# Patient Record
Sex: Female | Born: 1950 | Race: White | Hispanic: No | Marital: Married | Smoking: Never smoker
Health system: Southern US, Community
[De-identification: ages and names within clinical notes are randomized; demographics above are authoritative.]

## PROBLEM LIST (undated history)

## (undated) DIAGNOSIS — M810 Age-related osteoporosis without current pathological fracture: Secondary | ICD-10-CM

## (undated) DIAGNOSIS — Z8669 Personal history of other diseases of the nervous system and sense organs: Secondary | ICD-10-CM

## (undated) DIAGNOSIS — G43909 Migraine, unspecified, not intractable, without status migrainosus: Secondary | ICD-10-CM

## (undated) DIAGNOSIS — F419 Anxiety disorder, unspecified: Secondary | ICD-10-CM

## (undated) DIAGNOSIS — F319 Bipolar disorder, unspecified: Secondary | ICD-10-CM

## (undated) DIAGNOSIS — Z87440 Personal history of urinary (tract) infections: Secondary | ICD-10-CM

## (undated) DIAGNOSIS — C801 Malignant (primary) neoplasm, unspecified: Secondary | ICD-10-CM

## (undated) HISTORY — PX: ROTATOR CUFF REPAIR: SHX139

## (undated) HISTORY — PX: TOE SURGERY: SHX1073

## (undated) HISTORY — DX: Migraine, unspecified, not intractable, without status migrainosus: G43.909

## (undated) HISTORY — DX: Personal history of urinary (tract) infections: Z87.440

## (undated) HISTORY — DX: Malignant (primary) neoplasm, unspecified: C80.1

## (undated) HISTORY — PX: SKIN CANCER EXCISION: SHX779

## (undated) HISTORY — PX: COLONOSCOPY: SHX174

## (undated) HISTORY — DX: Anxiety disorder, unspecified: F41.9

## (undated) HISTORY — DX: Bipolar disorder, unspecified: F31.9

## (undated) HISTORY — DX: Personal history of other diseases of the nervous system and sense organs: Z86.69

## (undated) HISTORY — DX: Age-related osteoporosis without current pathological fracture: M81.0

---

## 1998-05-01 ENCOUNTER — Ambulatory Visit (HOSPITAL_COMMUNITY): Admission: RE | Admit: 1998-05-01 | Discharge: 1998-05-01 | Payer: Self-pay | Admitting: *Deleted

## 2000-09-30 ENCOUNTER — Other Ambulatory Visit: Admission: RE | Admit: 2000-09-30 | Discharge: 2000-09-30 | Payer: Self-pay

## 2002-01-22 ENCOUNTER — Other Ambulatory Visit: Admission: RE | Admit: 2002-01-22 | Discharge: 2002-01-22 | Payer: Self-pay | Admitting: Internal Medicine

## 2002-02-05 ENCOUNTER — Encounter: Admission: RE | Admit: 2002-02-05 | Discharge: 2002-02-05 | Payer: Self-pay | Admitting: Internal Medicine

## 2002-02-05 ENCOUNTER — Encounter: Payer: Self-pay | Admitting: Internal Medicine

## 2003-10-14 ENCOUNTER — Other Ambulatory Visit: Admission: RE | Admit: 2003-10-14 | Discharge: 2003-10-14 | Payer: Self-pay | Admitting: Internal Medicine

## 2005-07-01 ENCOUNTER — Other Ambulatory Visit: Admission: RE | Admit: 2005-07-01 | Discharge: 2005-07-01 | Payer: Self-pay | Admitting: Internal Medicine

## 2006-10-06 ENCOUNTER — Encounter: Admission: RE | Admit: 2006-10-06 | Discharge: 2006-10-06 | Payer: Self-pay | Admitting: Internal Medicine

## 2007-09-21 ENCOUNTER — Other Ambulatory Visit: Admission: RE | Admit: 2007-09-21 | Discharge: 2007-09-21 | Payer: Self-pay | Admitting: Internal Medicine

## 2008-11-25 ENCOUNTER — Ambulatory Visit: Payer: Self-pay | Admitting: Internal Medicine

## 2008-11-25 ENCOUNTER — Other Ambulatory Visit: Admission: RE | Admit: 2008-11-25 | Discharge: 2008-11-25 | Payer: Self-pay | Admitting: Internal Medicine

## 2009-12-23 ENCOUNTER — Ambulatory Visit: Payer: Self-pay | Admitting: Internal Medicine

## 2010-01-27 ENCOUNTER — Ambulatory Visit: Payer: Self-pay | Admitting: Internal Medicine

## 2010-03-09 ENCOUNTER — Ambulatory Visit: Payer: Self-pay | Admitting: Internal Medicine

## 2010-05-19 ENCOUNTER — Ambulatory Visit: Payer: Self-pay | Admitting: Internal Medicine

## 2011-03-30 ENCOUNTER — Other Ambulatory Visit: Payer: BC Managed Care – PPO | Admitting: Internal Medicine

## 2011-03-30 DIAGNOSIS — R5383 Other fatigue: Secondary | ICD-10-CM

## 2011-03-30 DIAGNOSIS — Z79899 Other long term (current) drug therapy: Secondary | ICD-10-CM

## 2011-03-30 DIAGNOSIS — Z0000 Encounter for general adult medical examination without abnormal findings: Secondary | ICD-10-CM

## 2011-03-30 LAB — COMPREHENSIVE METABOLIC PANEL
ALT: 19 U/L (ref 0–35)
AST: 21 U/L (ref 0–37)
Albumin: 4.5 g/dL (ref 3.5–5.2)
Alkaline Phosphatase: 51 U/L (ref 39–117)
BUN: 16 mg/dL (ref 6–23)
CO2: 24 mEq/L (ref 19–32)
Calcium: 10.6 mg/dL — ABNORMAL HIGH (ref 8.4–10.5)
Chloride: 105 mEq/L (ref 96–112)
Creat: 0.92 mg/dL (ref 0.50–1.10)
Glucose, Bld: 90 mg/dL (ref 70–99)
Potassium: 4.6 mEq/L (ref 3.5–5.3)
Sodium: 138 mEq/L (ref 135–145)
Total Bilirubin: 0.8 mg/dL (ref 0.3–1.2)
Total Protein: 6.7 g/dL (ref 6.0–8.3)

## 2011-03-30 LAB — CBC WITH DIFFERENTIAL/PLATELET
Basophils Absolute: 0.1 10*3/uL (ref 0.0–0.1)
Basophils Relative: 2 % — ABNORMAL HIGH (ref 0–1)
Eosinophils Absolute: 0.5 10*3/uL (ref 0.0–0.7)
Eosinophils Relative: 7 % — ABNORMAL HIGH (ref 0–5)
HCT: 41.7 % (ref 36.0–46.0)
Hemoglobin: 13.4 g/dL (ref 12.0–15.0)
Lymphocytes Relative: 33 % (ref 12–46)
Lymphs Abs: 2 10*3/uL (ref 0.7–4.0)
MCH: 33.1 pg (ref 26.0–34.0)
MCHC: 32.1 g/dL (ref 30.0–36.0)
MCV: 103 fL — ABNORMAL HIGH (ref 78.0–100.0)
Monocytes Absolute: 0.6 10*3/uL (ref 0.1–1.0)
Monocytes Relative: 9 % (ref 3–12)
Neutro Abs: 3 10*3/uL (ref 1.7–7.7)
Neutrophils Relative %: 49 % (ref 43–77)
Platelets: 269 10*3/uL (ref 150–400)
RBC: 4.05 MIL/uL (ref 3.87–5.11)
RDW: 13.4 % (ref 11.5–15.5)
WBC: 6.1 10*3/uL (ref 4.0–10.5)

## 2011-03-30 LAB — LIPID PANEL
HDL: 80 mg/dL (ref >39)
LDL Cholesterol: 91 mg/dL (ref 0–99)
Total CHOL/HDL Ratio: 2.2 Ratio

## 2011-03-30 LAB — TSH: TSH: 2.872 u[IU]/mL (ref 0.350–4.500)

## 2011-03-31 ENCOUNTER — Encounter: Payer: Self-pay | Admitting: Internal Medicine

## 2011-04-01 ENCOUNTER — Ambulatory Visit (INDEPENDENT_AMBULATORY_CARE_PROVIDER_SITE_OTHER): Payer: BC Managed Care – PPO | Admitting: Internal Medicine

## 2011-04-01 ENCOUNTER — Encounter: Payer: Self-pay | Admitting: Internal Medicine

## 2011-04-01 VITALS — BP 130/82 | HR 72 | Temp 98.7°F | Ht 63.75 in | Wt 129.0 lb

## 2011-04-01 DIAGNOSIS — F319 Bipolar disorder, unspecified: Secondary | ICD-10-CM

## 2011-04-01 DIAGNOSIS — G43909 Migraine, unspecified, not intractable, without status migrainosus: Secondary | ICD-10-CM

## 2011-04-01 DIAGNOSIS — Z87440 Personal history of urinary (tract) infections: Secondary | ICD-10-CM

## 2011-04-01 DIAGNOSIS — Z0000 Encounter for general adult medical examination without abnormal findings: Secondary | ICD-10-CM

## 2011-04-01 DIAGNOSIS — Z8669 Personal history of other diseases of the nervous system and sense organs: Secondary | ICD-10-CM | POA: Insufficient documentation

## 2011-04-01 LAB — POCT URINALYSIS DIPSTICK
Ketones, UA: NEGATIVE
Protein, UA: NEGATIVE
Spec Grav, UA: 1.01
pH, UA: 6

## 2011-04-01 NOTE — Patient Instructions (Signed)
Continue meds as previously described. Get mammogram and colonoscopy.

## 2011-04-01 NOTE — Progress Notes (Signed)
  Subjective:    Patient ID: Julia Lee, female    DOB: 1951/08/09, 60 y.o.   MRN: [id number]  HPI pleasant 60 year old white female with history of bipolar disorder well-controlled of lithium in today for health maintenance exam. History of migraine headaches and recurrent urinary tract infections. 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 No recent mammogram on file. Order for mammogram given to her today and she is to make her own appointment. She is off estrogen replacement therapy and migraines have improved. Please migraines was admitted nasal spray as needed. Last tetanus immunization April 2011. No complaints or problems today. In never had screening colonoscopy. Reminded about this today.    Review of Systems  Constitutional: Negative.   HENT: Negative.   Respiratory: Negative.   Cardiovascular: Negative.   Gastrointestinal: Negative.   Genitourinary: Negative.   Musculoskeletal: Negative.   Neurological: Negative.   Hematological: Negative.   Psychiatric/Behavioral: Negative.        Objective:   Physical Exam  Constitutional: She is oriented to person, place, and time. She appears well-nourished. No distress.  HENT:  Head: Normocephalic and atraumatic.  Right Ear: External ear normal.  Left Ear: External ear normal.  Nose: Nose normal.  Mouth/Throat: Oropharynx is clear and moist. No oropharyngeal exudate.  Eyes: Conjunctivae and EOM are normal. Pupils are equal, round, and reactive to light. Right eye exhibits no discharge. Left eye exhibits no discharge. No scleral icterus.  Neck: Normal range of motion. Neck supple. No JVD present. No tracheal deviation present. No thyromegaly present.  Cardiovascular: Normal rate, regular rhythm, normal heart sounds and intact distal pulses.  Exam reveals no gallop and no friction rub.   No murmur heard. Pulmonary/Chest: Effort normal and breath sounds normal. No stridor. No respiratory distress. She has no wheezes. She  has no rales. She exhibits no tenderness.  Abdominal: Soft. Bowel sounds are normal. She exhibits no distension and no mass. There is no tenderness. There is no rebound and no guarding.  Genitourinary: Uterus normal.       Bimanual only. Last PAP was 2010 and next PAP due is 2013  Musculoskeletal: She exhibits no edema and no tenderness.  Lymphadenopathy:    She has no cervical adenopathy.  Neurological: She is alert and oriented to person, place, and time. She has normal reflexes. She displays normal reflexes. No cranial nerve deficit. She exhibits normal muscle tone. Coordination normal.  Skin: Skin is warm and dry. No rash noted. She is not diaphoretic. No erythema.  Psychiatric: She has a normal mood and affect. Her behavior is normal. Judgment and thought content normal.          Assessment & Plan:  History of bipolar disorder  History of migraine headaches improved off estrogen replacement therapy  History of recurrent urinary tract infections none recently  Plan refills I made nasal spray when necessary 1 year. Okay to refill lithium for one year if drugstore calls. Otherwise return for physical exam in one year.

## 2011-04-07 ENCOUNTER — Other Ambulatory Visit: Payer: Self-pay

## 2011-04-09 ENCOUNTER — Other Ambulatory Visit: Payer: Self-pay

## 2011-04-09 MED ORDER — LITHIUM CARBONATE 300 MG PO TABS: 300.0000 mg | ORAL_TABLET | Freq: Every day | ORAL | Status: DC

## 2011-09-23 ENCOUNTER — Other Ambulatory Visit: Payer: Self-pay | Admitting: Internal Medicine

## 2011-09-23 DIAGNOSIS — Z1231 Encounter for screening mammogram for malignant neoplasm of breast: Secondary | ICD-10-CM

## 2011-10-19 ENCOUNTER — Ambulatory Visit
Admission: RE | Admit: 2011-10-19 | Discharge: 2011-10-19 | Disposition: A | Discharge status: Home / Self Care | Payer: BC Managed Care – PPO | Source: Ambulatory Visit | Attending: Internal Medicine | Admitting: Internal Medicine

## 2011-10-19 DIAGNOSIS — Z1231 Encounter for screening mammogram for malignant neoplasm of breast: Secondary | ICD-10-CM

## 2012-01-13 ENCOUNTER — Other Ambulatory Visit: Payer: Self-pay

## 2012-01-13 MED ORDER — LITHIUM CARBONATE 300 MG PO TABS: 300.0000 mg | ORAL_TABLET | Freq: Every day | ORAL | Status: DC

## 2012-04-03 ENCOUNTER — Other Ambulatory Visit: Payer: BC Managed Care – PPO | Admitting: Internal Medicine

## 2012-04-04 ENCOUNTER — Encounter: Payer: BC Managed Care – PPO | Admitting: Internal Medicine

## 2012-04-21 DIAGNOSIS — G43709 Chronic migraine without aura, not intractable, without status migrainosus: Secondary | ICD-10-CM

## 2012-06-18 ENCOUNTER — Encounter (HOSPITAL_COMMUNITY): Payer: Self-pay

## 2012-06-18 ENCOUNTER — Emergency Department (HOSPITAL_COMMUNITY): Payer: BC Managed Care – PPO

## 2012-06-18 ENCOUNTER — Emergency Department (HOSPITAL_COMMUNITY)
Admission: EM | Admit: 2012-06-18 | Discharge: 2012-06-19 | Disposition: A | Discharge status: Home / Self Care | Payer: BC Managed Care – PPO | Attending: Emergency Medicine | Admitting: Emergency Medicine

## 2012-06-18 DIAGNOSIS — Z79899 Other long term (current) drug therapy: Secondary | ICD-10-CM | POA: Insufficient documentation

## 2012-06-18 DIAGNOSIS — Y9355 Activity, bike riding: Secondary | ICD-10-CM | POA: Insufficient documentation

## 2012-06-18 DIAGNOSIS — F319 Bipolar disorder, unspecified: Secondary | ICD-10-CM | POA: Insufficient documentation

## 2012-06-18 DIAGNOSIS — S301XXA Contusion of abdominal wall, initial encounter: Secondary | ICD-10-CM | POA: Insufficient documentation

## 2012-06-18 DIAGNOSIS — Z85828 Personal history of other malignant neoplasm of skin: Secondary | ICD-10-CM | POA: Insufficient documentation

## 2012-06-18 LAB — CBC WITH DIFFERENTIAL/PLATELET
Basophils Relative: 1 % (ref 0–1)
Eosinophils Relative: 8 % — ABNORMAL HIGH (ref 0–5)
HCT: 37.7 % (ref 36.0–46.0)
Hemoglobin: 12.8 g/dL (ref 12.0–15.0)
MCH: 32.7 pg (ref 26.0–34.0)
MCHC: 34 g/dL (ref 30.0–36.0)
MCV: 96.2 fL (ref 78.0–100.0)
Monocytes Absolute: 0.7 10*3/uL (ref 0.1–1.0)
Monocytes Relative: 9 % (ref 3–12)
Neutro Abs: 4.5 10*3/uL (ref 1.7–7.7)

## 2012-06-18 LAB — POCT I-STAT, CHEM 8
BUN: 16 mg/dL (ref 6–23)
Calcium, Ion: 1.36 mmol/L — ABNORMAL HIGH (ref 1.13–1.30)
Chloride: 106 mEq/L (ref 96–112)
Creatinine, Ser: 0.9 mg/dL (ref 0.50–1.10)

## 2012-06-18 LAB — URINALYSIS, ROUTINE W REFLEX MICROSCOPIC
Bilirubin Urine: NEGATIVE
Glucose, UA: NEGATIVE mg/dL
Hgb urine dipstick: NEGATIVE
Ketones, ur: NEGATIVE mg/dL
Protein, ur: NEGATIVE mg/dL
pH: 7 (ref 5.0–8.0)

## 2012-06-18 LAB — URINE MICROSCOPIC-ADD ON

## 2012-06-18 MED ORDER — MORPHINE SULFATE 2 MG/ML IJ SOLN
2.0000 mg | Freq: Once | INTRAMUSCULAR | Status: AC
  Administered 2012-06-18: 2 mg via INTRAVENOUS
  Filled 2012-06-18: qty 1

## 2012-06-18 MED ORDER — NAPROXEN 500 MG PO TABS: 500.0000 mg | ORAL_TABLET | Freq: Two times a day (BID) | ORAL | Status: AC

## 2012-06-18 MED ORDER — IOHEXOL 300 MG/ML  SOLN
100.0000 mL | Freq: Once | INTRAMUSCULAR | Status: AC | PRN
  Administered 2012-06-18: 100 mL via INTRAVENOUS
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
## 2012-06-19 ENCOUNTER — Telehealth: Payer: Self-pay | Admitting: Internal Medicine
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
## 2013-01-27 ENCOUNTER — Other Ambulatory Visit: Payer: Self-pay | Admitting: Internal Medicine

## 2013-02-06 ENCOUNTER — Encounter: Payer: Self-pay | Admitting: Internal Medicine

## 2013-02-06 ENCOUNTER — Ambulatory Visit (INDEPENDENT_AMBULATORY_CARE_PROVIDER_SITE_OTHER): Payer: BC Managed Care – PPO | Admitting: Internal Medicine

## 2013-02-06 VITALS — BP 134/84 | Temp 98.0°F | Wt 128.0 lb

## 2013-02-06 DIAGNOSIS — N898 Other specified noninflammatory disorders of vagina: Secondary | ICD-10-CM

## 2013-02-06 DIAGNOSIS — N9489 Other specified conditions associated with female genital organs and menstrual cycle: Secondary | ICD-10-CM

## 2013-02-06 DIAGNOSIS — F319 Bipolar disorder, unspecified: Secondary | ICD-10-CM

## 2013-02-06 MED ORDER — LITHIUM CARBONATE 300 MG PO TABS: 300.0000 mg | ORAL_TABLET | Freq: Every day | ORAL | Status: DC

## 2013-02-06 MED ORDER — LITHIUM CARBONATE ER 450 MG PO TBCR: 450.0000 mg | EXTENDED_RELEASE_TABLET | Freq: Every day | ORAL | Status: DC

## 2013-02-07 LAB — LITHIUM LEVEL: Lithium Lvl: 1 mEq/L (ref 0.80–1.40)

## 2013-02-08 DIAGNOSIS — N898 Other specified noninflammatory disorders of vagina: Secondary | ICD-10-CM | POA: Insufficient documentation
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
## 2013-02-08 NOTE — Progress Notes (Signed)
Patient informed. 

## 2013-02-08 NOTE — Patient Instructions (Addendum)
Continue same dose of lithium. Try Premarin vaginal cream 3 times weekly for vaginal dryness. Schedule physical exam the next 6 months.

## 2013-02-26 ENCOUNTER — Other Ambulatory Visit: Payer: Self-pay | Admitting: Internal Medicine

## 2014-01-28 ENCOUNTER — Other Ambulatory Visit: Payer: Self-pay | Admitting: Internal Medicine

## 2014-01-28 NOTE — Telephone Encounter (Signed)
Last seen 02/06/13. Do not see upcoming CPE or appt. Cannot refill unless seen.

## 2014-01-31 ENCOUNTER — Other Ambulatory Visit: Payer: Self-pay

## 2014-01-31 MED ORDER — LITHIUM CARBONATE ER 450 MG PO TBCR: 450.0000 mg | EXTENDED_RELEASE_TABLET | Freq: Every day | ORAL | Status: DC

## 2014-01-31 MED ORDER — LITHIUM CARBONATE 300 MG PO TABS: 300.0000 mg | ORAL_TABLET | Freq: Every day | ORAL | Status: DC

## 2014-02-08 ENCOUNTER — Other Ambulatory Visit: Payer: BC Managed Care – PPO | Admitting: Internal Medicine

## 2014-02-08 ENCOUNTER — Other Ambulatory Visit: Payer: Self-pay | Admitting: Internal Medicine

## 2014-02-08 DIAGNOSIS — F319 Bipolar disorder, unspecified: Secondary | ICD-10-CM

## 2014-02-09 LAB — LITHIUM LEVEL: Lithium Lvl: 0.6 mEq/L — ABNORMAL LOW (ref 0.80–1.40)

## 2014-02-11 ENCOUNTER — Encounter: Payer: Self-pay | Admitting: Internal Medicine

## 2014-02-11 ENCOUNTER — Ambulatory Visit (INDEPENDENT_AMBULATORY_CARE_PROVIDER_SITE_OTHER): Payer: BC Managed Care – PPO | Admitting: Internal Medicine

## 2014-02-11 VITALS — BP 126/84 | HR 76 | Temp 99.2°F | Ht 64.0 in | Wt 129.5 lb

## 2014-02-11 DIAGNOSIS — F319 Bipolar disorder, unspecified: Secondary | ICD-10-CM

## 2014-02-11 LAB — CREATININE, SERUM: CREATININE: 0.87 mg/dL (ref 0.50–1.10)

## 2014-02-11 LAB — TSH: TSH: 2.467 u[IU]/mL (ref 0.350–4.500)

## 2014-02-11 NOTE — Patient Instructions (Signed)
Continue same dose of lithium in return in one year

## 2014-02-11 NOTE — Progress Notes (Signed)
   Subjective:    Patient ID: Julia Lee, female    DOB: February 06, 1951, 63 y.o.   MRN: [id number]  HPI Has been on lithium for over 20 years for bipolar disorder and has done well. Dose has not changed. Recent lithium level is slightly low but she feels well and we are not going to  change dose at this point in time. She does not have GYN physician. Remote history of urinary tract infections and migraine headaches. Migraines improved when she was off estrogen therapy and having gone through menopause. 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 Generally spends summers in [place] with her husband. He is a Pensions consultant at [employer] there.  Patient has never had colonoscopy. Says she gets mammogram every few years. Last mammogram was in 2012. Last Pap smear 2010. Patient did not want to come in today for this appointment but we've persuaded her that she needed to come to get her lithium refilled.  She has agreed to have physical exam next year. We have given her 3 Hemoccult cards to return to this office since she does not want to have colonoscopy. I have written order for her to have Zostavax vaccine at pharmacy  She's been on lithium since 1992 when she was hospitalized with a manic episode. Was treated by Dr. Ovidio Hanger. Later saw Dr. Haywood Lasso we in 2006. He had her on Eskalith 450 mg q am and lithium carbonate 300 mg q pm and we have maintain that dosage since that time.    Review of Systems     Objective:   Physical Exam  Skin warm and dry. Nodes none. Chest clear. No thyromegaly. Cardiac exam regular rate and rhythm. Extremities without edema      Assessment & Plan:  History of bipolar disorder-stable on lithium therapy  Plan: Return in one year at which time she'll have complete physical examination including Pap smear, mammogram, and fasting lab work including lithium  level. Lithium will be refilled for one year. Try to add TSH and creatinine to recent lab work.

## 2014-02-20 ENCOUNTER — Other Ambulatory Visit: Payer: Self-pay | Admitting: Internal Medicine

## 2014-02-21 ENCOUNTER — Other Ambulatory Visit: Payer: Self-pay

## 2014-02-21 MED ORDER — LITHIUM CARBONATE 300 MG PO CAPS: 300.0000 mg | ORAL_CAPSULE | Freq: Every day | ORAL | Status: DC

## 2014-02-21 MED ORDER — LITHIUM CARBONATE ER 450 MG PO TBCR: 450.0000 mg | EXTENDED_RELEASE_TABLET | Freq: Every day | ORAL | Status: DC

## 2014-03-26 ENCOUNTER — Other Ambulatory Visit: Payer: Self-pay | Admitting: Internal Medicine

## 2014-08-05 ENCOUNTER — Encounter: Payer: Self-pay | Admitting: Internal Medicine

## 2014-12-03 ENCOUNTER — Telehealth: Payer: Self-pay | Admitting: Internal Medicine

## 2014-12-03 NOTE — Telephone Encounter (Signed)
Spoke with patient relayed message regarding Dr Percell Miller or Mardelle Matte for shoulder surgeries

## 2014-12-03 NOTE — Telephone Encounter (Signed)
Patient called; states she tore her rotator cuff.  Saw her orthopedic doc who recommended surgery by Dr. Caro Laroche.  Wants to know what you think of this surgeon.  Values your opinion because she has been seeing you for so many years.  Do you feel Dr. Percell Miller would be a good fit for her and handle this particular surgery well?    Please advise and thanks.

## 2014-12-03 NOTE — Telephone Encounter (Signed)
I think  Dr. Fredonia Highland (Dan's son) and Dr. Mardelle Matte all insame practice are  the one doing these surgeries.

## 2014-12-26 ENCOUNTER — Telehealth: Payer: Self-pay | Admitting: Internal Medicine

## 2014-12-26 ENCOUNTER — Telehealth: Payer: Self-pay | Admitting: *Deleted

## 2014-12-26 ENCOUNTER — Encounter: Payer: Self-pay | Admitting: Internal Medicine

## 2014-12-26 MED ORDER — ZOLMITRIPTAN 5 MG NA SOLN: 1.0000 | NASAL | Status: DC | PRN

## 2014-12-26 NOTE — Telephone Encounter (Signed)
Patient called requesting refill on Zomig for migraine headache. We were unable to get that request process because of a request for a prior authorization. Patient was contacted. She no longer has the headache at this point in time. Says that she's tried Zomig in the past and it worked well for her. We will try to get prior authorization tomorrow since she no longer has headache this evening.

## 2014-12-26 NOTE — Telephone Encounter (Signed)
zomig script sent to pharmacy

## 2014-12-26 NOTE — Telephone Encounter (Signed)
Call in Zomig nasal sray

## 2014-12-26 NOTE — Telephone Encounter (Signed)
States she recently had rotator cuff surgery and the pain med she is taking has triggered a migraine . She would like to know if you will call in Zomig nasal spray for her. Please advise

## 2015-02-25 ENCOUNTER — Other Ambulatory Visit: Payer: Self-pay | Admitting: Internal Medicine

## 2015-02-26 ENCOUNTER — Other Ambulatory Visit: Payer: BC Managed Care – PPO | Admitting: Internal Medicine

## 2015-02-26 ENCOUNTER — Encounter: Payer: Self-pay | Admitting: Internal Medicine

## 2015-02-26 ENCOUNTER — Ambulatory Visit (INDEPENDENT_AMBULATORY_CARE_PROVIDER_SITE_OTHER): Payer: BC Managed Care – PPO | Admitting: Internal Medicine

## 2015-02-26 VITALS — BP 126/86 | HR 70 | Temp 98.1°F | Ht 64.0 in | Wt 127.0 lb

## 2015-02-26 DIAGNOSIS — Z13220 Encounter for screening for lipoid disorders: Secondary | ICD-10-CM

## 2015-02-26 DIAGNOSIS — Z1321 Encounter for screening for nutritional disorder: Secondary | ICD-10-CM

## 2015-02-26 DIAGNOSIS — Z1329 Encounter for screening for other suspected endocrine disorder: Secondary | ICD-10-CM

## 2015-02-26 DIAGNOSIS — Z79899 Other long term (current) drug therapy: Secondary | ICD-10-CM | POA: Diagnosis not present

## 2015-02-26 DIAGNOSIS — Z0000 Encounter for general adult medical examination without abnormal findings: Secondary | ICD-10-CM

## 2015-02-26 DIAGNOSIS — Z130 Encounter for screening for diseases of the blood and blood-forming organs and certain disorders involving the immune mechanism: Secondary | ICD-10-CM

## 2015-02-26 LAB — CBC WITH DIFFERENTIAL/PLATELET
BASOS ABS: 0.1 10*3/uL (ref 0.0–0.1)
Basophils Relative: 2 % — ABNORMAL HIGH (ref 0–1)
EOS ABS: 0.2 10*3/uL (ref 0.0–0.7)
EOS PCT: 4 % (ref 0–5)
HCT: 42.9 % (ref 36.0–46.0)
Hemoglobin: 14.3 g/dL (ref 12.0–15.0)
Lymphocytes Relative: 28 % (ref 12–46)
Lymphs Abs: 1.6 10*3/uL (ref 0.7–4.0)
MCH: 32.4 pg (ref 26.0–34.0)
MCHC: 33.3 g/dL (ref 30.0–36.0)
MCV: 97.1 fL (ref 78.0–100.0)
MPV: 11 fL (ref 8.6–12.4)
Monocytes Absolute: 0.5 10*3/uL (ref 0.1–1.0)
Monocytes Relative: 9 % (ref 3–12)
Neutro Abs: 3.3 10*3/uL (ref 1.7–7.7)
Neutrophils Relative %: 57 % (ref 43–77)
PLATELETS: 321 10*3/uL (ref 150–400)
RBC: 4.42 MIL/uL (ref 3.87–5.11)
RDW: 13 % (ref 11.5–15.5)
WBC: 5.8 10*3/uL (ref 4.0–10.5)

## 2015-02-26 LAB — COMPLETE METABOLIC PANEL WITH GFR
ALK PHOS: 52 U/L (ref 39–117)
ALT: 18 U/L (ref 0–35)
AST: 17 U/L (ref 0–37)
Albumin: 5.1 g/dL (ref 3.5–5.2)
BILIRUBIN TOTAL: 0.8 mg/dL (ref 0.2–1.2)
BUN: 18 mg/dL (ref 6–23)
CHLORIDE: 102 meq/L (ref 96–112)
CO2: 23 mEq/L (ref 19–32)
CREATININE: 0.84 mg/dL (ref 0.50–1.10)
Calcium: 10.6 mg/dL — ABNORMAL HIGH (ref 8.4–10.5)
GFR, Est African American: 86 mL/min
GFR, Est Non African American: 74 mL/min
Glucose, Bld: 101 mg/dL — ABNORMAL HIGH (ref 70–99)
POTASSIUM: 4.5 meq/L (ref 3.5–5.3)
SODIUM: 137 meq/L (ref 135–145)
Total Protein: 7.6 g/dL (ref 6.0–8.3)

## 2015-02-26 LAB — POCT URINALYSIS DIPSTICK
BILIRUBIN UA: NEGATIVE
Blood, UA: NEGATIVE
Glucose, UA: NEGATIVE
Ketones, UA: NEGATIVE
LEUKOCYTES UA: NEGATIVE
NITRITE UA: NEGATIVE
PH UA: 7.5
PROTEIN UA: NEGATIVE
Spec Grav, UA: <=1.005
Urobilinogen, UA: NEGATIVE

## 2015-02-26 LAB — LIPID PANEL
CHOLESTEROL: 200 mg/dL (ref 0–200)
HDL: 87 mg/dL (ref >=46)
LDL Cholesterol: 92 mg/dL (ref 0–99)
TRIGLYCERIDES: 107 mg/dL (ref <150)
Total CHOL/HDL Ratio: 2.3 Ratio
VLDL: 21 mg/dL (ref 0–40)

## 2015-02-26 LAB — TSH: TSH: 2.404 u[IU]/mL (ref 0.350–4.500)

## 2015-02-26 MED ORDER — LITHIUM CARBONATE 300 MG PO CAPS: 300.0000 mg | ORAL_CAPSULE | Freq: Every day | ORAL | Status: DC

## 2015-02-26 MED ORDER — LITHIUM CARBONATE ER 450 MG PO TBCR: EXTENDED_RELEASE_TABLET | ORAL | Status: DC
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
## 2015-02-26 NOTE — Patient Instructions (Signed)
Fasting labs including TSH and creatinine are pending. Continue same medications and return in one year or as needed. 3 Hemoccult cards given. Encourage mammogram. Consider Zostavax vaccine.

## 2015-02-27 LAB — LITHIUM LEVEL: Lithium Lvl: 0.8 mEq/L (ref 0.80–1.40)

## 2015-02-27 LAB — VITAMIN D 25 HYDROXY (VIT D DEFICIENCY, FRACTURES): Vit D, 25-Hydroxy: 62 ng/mL (ref 30–100)

## 2015-03-13 ENCOUNTER — Other Ambulatory Visit: Payer: Self-pay | Admitting: Internal Medicine

## 2015-03-13 NOTE — Telephone Encounter (Signed)
Pt takes 2 different doses

## 2015-08-20 ENCOUNTER — Telehealth: Payer: Self-pay | Admitting: Internal Medicine

## 2015-08-20 NOTE — Telephone Encounter (Signed)
Pharmacy calling on her Lithium 300mg .  Patient getting refill.  Their manufacturer has changed by their wholesaler.  Is it ok to change the Roxanne brand (same drug/strength)?  Doesn't effect the patient at all in terms of pricing at all, they just need physician approval to change over since it's a different manufacturer.    Please advise and thanks.

## 2015-08-20 NOTE — Telephone Encounter (Signed)
[facility] for patient @ cell # [phone].  Dr. Renold Genta is ok with changing the wholesaler.  HOWEVER, the final decision is up to the patient.  IF the patient is ok with the change, she is ok with the change.  Lithium is a tricky drug.  Therefore, the decision is up to the patient.

## 2015-08-21 NOTE — Telephone Encounter (Signed)
Pharmacy called back today; advised that they had confirmed with patient that SHE is ok with the change in manufacturer.  Advised that Dr. Renold Genta is ok as long as patient is ok with the change.

## 2015-09-08 ENCOUNTER — Other Ambulatory Visit: Payer: Self-pay

## 2015-09-08 DIAGNOSIS — Z1231 Encounter for screening mammogram for malignant neoplasm of breast: Secondary | ICD-10-CM

## 2015-09-10 ENCOUNTER — Ambulatory Visit: Payer: BC Managed Care – PPO

## 2015-09-16 ENCOUNTER — Ambulatory Visit
Admission: RE | Admit: 2015-09-16 | Discharge: 2015-09-16 | Disposition: A | Discharge status: Home / Self Care | Payer: BC Managed Care – PPO | Source: Ambulatory Visit

## 2015-09-16 DIAGNOSIS — Z1231 Encounter for screening mammogram for malignant neoplasm of breast: Secondary | ICD-10-CM

## 2015-09-18 ENCOUNTER — Other Ambulatory Visit: Payer: Self-pay | Admitting: Internal Medicine

## 2015-09-18 DIAGNOSIS — R928 Other abnormal and inconclusive findings on diagnostic imaging of breast: Secondary | ICD-10-CM

## 2015-09-25 ENCOUNTER — Ambulatory Visit
Admission: RE | Admit: 2015-09-25 | Discharge: 2015-09-25 | Disposition: A | Discharge status: Home / Self Care | Payer: BC Managed Care – PPO | Source: Ambulatory Visit | Attending: Internal Medicine | Admitting: Internal Medicine

## 2015-09-25 DIAGNOSIS — R928 Other abnormal and inconclusive findings on diagnostic imaging of breast: Secondary | ICD-10-CM

## 2016-01-19 ENCOUNTER — Encounter: Payer: Self-pay | Admitting: Internal Medicine

## 2016-01-19 ENCOUNTER — Ambulatory Visit (INDEPENDENT_AMBULATORY_CARE_PROVIDER_SITE_OTHER): Payer: BC Managed Care – PPO | Admitting: Internal Medicine

## 2016-01-19 VITALS — BP 126/68 | HR 68 | Temp 98.3°F | Resp 20 | Ht 64.0 in | Wt 128.5 lb

## 2016-01-19 DIAGNOSIS — J0100 Acute maxillary sinusitis, unspecified: Secondary | ICD-10-CM

## 2016-01-19 DIAGNOSIS — H6502 Acute serous otitis media, left ear: Secondary | ICD-10-CM

## 2016-01-19 MED ORDER — LEVOFLOXACIN 500 MG PO TABS: 500.0000 mg | ORAL_TABLET | Freq: Every day | ORAL | Status: DC

## 2016-01-19 NOTE — Progress Notes (Signed)
   Subjective:    Patient ID: Julia Lee, female    DOB: 12-28-1950, 65 y.o.   MRN: [id number]  HPI 65 year old Female with history of right maxillary tooth removed by Dr. Rushie Goltz of weeks ago which was followed by the development of a respiratory infection and sinusitis type symptoms. She has frontal and left maxillary sinus tenderness. Has had discolored nasal drainage. Sounds nasally congested when she speaks. No sore throat. No fever or chills. Seldom gets ill with respiratory infections. No significant cough. No sore throat.       Review of Systems see above     Objective:   Physical Exam  Skin warm and dry. Nodes none. Left TM is retracted but not red. Right TM slightly full but not red. Pharynx is clear. Neck is supple. Chest clear to auscultation without rales or wheezing.      Assessment & Plan:  Acute maxillary sinusitis  Acute left serous otitis media  Plan: Levaquin 500 milligrams daily for 10 days. If not better in 2 weeks, patient is to ask oral surgeon to see her and do  x-rays since she has had recent dental work  with subsequent development of  acute maxillary sinusitis on same side as tooth extraction

## 2016-01-19 NOTE — Patient Instructions (Addendum)
Take Levaquin 500 mg daily x 10 days. See oral surgeon if not getting better in 2 weeks.

## 2016-02-14 ENCOUNTER — Other Ambulatory Visit: Payer: Self-pay | Admitting: Internal Medicine

## 2016-02-16 ENCOUNTER — Telehealth: Payer: Self-pay | Admitting: Internal Medicine

## 2016-02-16 NOTE — Telephone Encounter (Signed)
Patient wanted her Lithium level drawn prior to the end of the month.  Leaving for [place] for the next 2 1/2 months.  Advised that per Dr. Renold Genta she is going to need a CPE.  Booking for CPE's in June.  Patient advised that this will not work for her as they will be leaving in May for [place] and she MUST have this Lithium level drawn prior to leaving.  Advised that her meds had been denied because she must be seen at least once per year.  Patient advised that this is not true.  She has know Dr. Renold Genta for 30 years and this has never been a requirement for this practice.  AMA doesn't require a CPE for Lithium to be filled, only a level to be drawn.  Advised that I was not speaking on behalf of the [facility], I was providing the protocols specific to this practice.  Patient further advised that this still was not accurate information.  She advised me that I should speak to Dr. Renold Genta and tell her the situation.    Do you want to make any schedule changes on a Wednesday to work this patient in for May?  Otherwise, I do not have a May physical opening to get her in prior to her leaving for [place].  I suggested to the patient that she should be fair to Dr. Renold Genta as well in the future when scheduling to be out of town for extended periods of time.  Patient verbalized understanding of our conversation.

## 2016-02-16 NOTE — Telephone Encounter (Signed)
Spoke with Dr. Renold Genta and she'll see the patient for her CPE on Wednesday 5/17.  Spoke with patient and she'll come for PE Labs on 5/16 @ 9:00.  CPE on 5/17 @ 10:00.  Patient is thrilled for the accommodation and getting her in for her PE prior to her [place] trip for 2 1/2 months.    THANK YOU Dr. Renold Genta.

## 2016-02-20 ENCOUNTER — Other Ambulatory Visit: Payer: Self-pay | Admitting: Internal Medicine

## 2016-02-20 NOTE — Telephone Encounter (Signed)
Pt has made appt. 

## 2016-02-24 ENCOUNTER — Other Ambulatory Visit: Payer: BC Managed Care – PPO | Admitting: Internal Medicine

## 2016-02-24 DIAGNOSIS — Z1321 Encounter for screening for nutritional disorder: Secondary | ICD-10-CM

## 2016-02-24 DIAGNOSIS — Z1329 Encounter for screening for other suspected endocrine disorder: Secondary | ICD-10-CM

## 2016-02-24 DIAGNOSIS — Z13220 Encounter for screening for lipoid disorders: Secondary | ICD-10-CM

## 2016-02-24 DIAGNOSIS — Z0000 Encounter for general adult medical examination without abnormal findings: Secondary | ICD-10-CM

## 2016-02-24 DIAGNOSIS — Z79899 Other long term (current) drug therapy: Secondary | ICD-10-CM

## 2016-02-24 LAB — COMPLETE METABOLIC PANEL WITH GFR
ALK PHOS: 54 U/L (ref 33–130)
ALT: 14 U/L (ref 6–29)
AST: 17 U/L (ref 10–35)
Albumin: 4.5 g/dL (ref 3.6–5.1)
BILIRUBIN TOTAL: 0.9 mg/dL (ref 0.2–1.2)
BUN: 23 mg/dL (ref 7–25)
CO2: 25 mmol/L (ref 20–31)
CREATININE: 0.89 mg/dL (ref 0.50–0.99)
Calcium: 10.6 mg/dL — ABNORMAL HIGH (ref 8.6–10.4)
Chloride: 104 mmol/L (ref 98–110)
GFR, EST AFRICAN AMERICAN: 79 mL/min (ref >=60)
GFR, EST NON AFRICAN AMERICAN: 69 mL/min (ref >=60)
GLUCOSE: 107 mg/dL — AB (ref 65–99)
POTASSIUM: 5.1 mmol/L (ref 3.5–5.3)
SODIUM: 137 mmol/L (ref 135–146)
Total Protein: 6.6 g/dL (ref 6.1–8.1)

## 2016-02-24 LAB — CBC WITH DIFFERENTIAL/PLATELET
Basophils Absolute: 84 cells/uL (ref 0–200)
Basophils Relative: 2 %
EOS PCT: 6 %
Eosinophils Absolute: 252 cells/uL (ref 15–500)
HCT: 40.5 % (ref 35.0–45.0)
Hemoglobin: 13.3 g/dL (ref 11.7–15.5)
LYMPHS PCT: 39 %
Lymphs Abs: 1638 cells/uL (ref 850–3900)
MCH: 32.3 pg (ref 27.0–33.0)
MCHC: 32.8 g/dL (ref 32.0–36.0)
MCV: 98.3 fL (ref 80.0–100.0)
MONOS PCT: 12 %
MPV: 10.8 fL (ref 7.5–12.5)
Monocytes Absolute: 504 cells/uL (ref 200–950)
NEUTROS PCT: 41 %
Neutro Abs: 1722 cells/uL (ref 1500–7800)
PLATELETS: 283 10*3/uL (ref 140–400)
RBC: 4.12 MIL/uL (ref 3.80–5.10)
RDW: 12.8 % (ref 11.0–15.0)
WBC: 4.2 10*3/uL (ref 3.8–10.8)

## 2016-02-24 LAB — LIPID PANEL
Cholesterol: 211 mg/dL — ABNORMAL HIGH (ref 125–200)
HDL: 106 mg/dL (ref >=46)
LDL CALC: 95 mg/dL (ref <130)
TRIGLYCERIDES: 50 mg/dL (ref <150)
Total CHOL/HDL Ratio: 2 Ratio (ref <=5.0)
VLDL: 10 mg/dL (ref <30)

## 2016-02-24 LAB — TSH: TSH: 2.78 m[IU]/L

## 2016-02-25 ENCOUNTER — Encounter: Payer: BC Managed Care – PPO | Admitting: Internal Medicine

## 2016-02-25 LAB — LITHIUM LEVEL: LITHIUM LVL: 0.7 meq/L — AB (ref 0.80–1.40)

## 2016-02-25 LAB — VITAMIN D 25 HYDROXY (VIT D DEFICIENCY, FRACTURES): Vit D, 25-Hydroxy: 61 ng/mL (ref 30–100)

## 2016-02-27 ENCOUNTER — Encounter: Payer: Self-pay | Admitting: Internal Medicine

## 2016-02-27 ENCOUNTER — Ambulatory Visit (INDEPENDENT_AMBULATORY_CARE_PROVIDER_SITE_OTHER): Payer: BC Managed Care – PPO | Admitting: Internal Medicine

## 2016-02-27 VITALS — BP 128/84 | HR 74 | Temp 98.3°F | Resp 18 | Wt 127.0 lb

## 2016-02-27 DIAGNOSIS — Z0000 Encounter for general adult medical examination without abnormal findings: Secondary | ICD-10-CM

## 2016-02-27 DIAGNOSIS — R7302 Impaired glucose tolerance (oral): Secondary | ICD-10-CM

## 2016-02-27 DIAGNOSIS — F3170 Bipolar disorder, currently in remission, most recent episode unspecified: Secondary | ICD-10-CM

## 2016-02-27 NOTE — Patient Instructions (Signed)
It was a pleasure to see you today!   

## 2016-02-28 LAB — HEMOGLOBIN A1C
HEMOGLOBIN A1C: 4.9 % (ref <5.7)
Mean Plasma Glucose: 94 mg/dL

## 2016-03-01 LAB — PARATHYROID HORMONE, INTACT (NO CA): PTH: 54 pg/mL (ref 14–64)
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
## 2017-01-31 ENCOUNTER — Other Ambulatory Visit: Payer: Medicare Other | Admitting: Internal Medicine

## 2017-01-31 DIAGNOSIS — E2839 Other primary ovarian failure: Secondary | ICD-10-CM

## 2017-01-31 DIAGNOSIS — E785 Hyperlipidemia, unspecified: Secondary | ICD-10-CM

## 2017-01-31 DIAGNOSIS — F3170 Bipolar disorder, currently in remission, most recent episode unspecified: Secondary | ICD-10-CM

## 2017-01-31 DIAGNOSIS — Z0000 Encounter for general adult medical examination without abnormal findings: Secondary | ICD-10-CM

## 2017-01-31 LAB — LIPID PANEL
CHOL/HDL RATIO: 2.2 ratio (ref <5.0)
Cholesterol: 188 mg/dL (ref <200)
HDL: 87 mg/dL (ref >50)
LDL CALC: 88 mg/dL (ref <100)
Triglycerides: 66 mg/dL (ref <150)
VLDL: 13 mg/dL (ref <30)

## 2017-01-31 LAB — COMPREHENSIVE METABOLIC PANEL
ALT: 17 U/L (ref 6–29)
AST: 18 U/L (ref 10–35)
Albumin: 4.5 g/dL (ref 3.6–5.1)
Alkaline Phosphatase: 48 U/L (ref 33–130)
BUN: 27 mg/dL — AB (ref 7–25)
CHLORIDE: 106 mmol/L (ref 98–110)
CO2: 21 mmol/L (ref 20–31)
Calcium: 10.5 mg/dL — ABNORMAL HIGH (ref 8.6–10.4)
Creat: 0.9 mg/dL (ref 0.50–0.99)
GLUCOSE: 97 mg/dL (ref 65–99)
POTASSIUM: 5.4 mmol/L — AB (ref 3.5–5.3)
SODIUM: 137 mmol/L (ref 135–146)
Total Bilirubin: 0.5 mg/dL (ref 0.2–1.2)
Total Protein: 6.9 g/dL (ref 6.1–8.1)

## 2017-01-31 LAB — CBC WITH DIFFERENTIAL/PLATELET
BASOS ABS: 96 {cells}/uL (ref 0–200)
Basophils Relative: 2 %
EOS PCT: 6 %
Eosinophils Absolute: 288 cells/uL (ref 15–500)
HEMATOCRIT: 42 % (ref 35.0–45.0)
HEMOGLOBIN: 13.7 g/dL (ref 11.7–15.5)
LYMPHS ABS: 1776 {cells}/uL (ref 850–3900)
Lymphocytes Relative: 37 %
MCH: 32.9 pg (ref 27.0–33.0)
MCHC: 32.6 g/dL (ref 32.0–36.0)
MCV: 100.7 fL — ABNORMAL HIGH (ref 80.0–100.0)
MONO ABS: 576 {cells}/uL (ref 200–950)
MPV: 10.9 fL (ref 7.5–12.5)
Monocytes Relative: 12 %
NEUTROS ABS: 2064 {cells}/uL (ref 1500–7800)
NEUTROS PCT: 43 %
Platelets: 283 10*3/uL (ref 140–400)
RBC: 4.17 MIL/uL (ref 3.80–5.10)
RDW: 12.9 % (ref 11.0–15.0)
WBC: 4.8 10*3/uL (ref 3.8–10.8)

## 2017-01-31 LAB — TSH: TSH: 3.19 m[IU]/L

## 2017-01-31 NOTE — Addendum Note (Signed)
Addended by: Inocencio Homes on: 01/31/2017 12:56 PM   Modules accepted: Orders

## 2017-02-01 LAB — VITAMIN D 25 HYDROXY (VIT D DEFICIENCY, FRACTURES): VIT D 25 HYDROXY: 70 ng/mL (ref 30–100)

## 2017-02-01 LAB — LITHIUM LEVEL: Lithium Lvl: 0.9 mmol/L (ref 0.6–1.2)

## 2017-02-02 ENCOUNTER — Other Ambulatory Visit: Payer: Self-pay | Admitting: Internal Medicine

## 2017-02-07 ENCOUNTER — Encounter: Payer: Self-pay | Admitting: Internal Medicine

## 2017-02-07 ENCOUNTER — Ambulatory Visit (INDEPENDENT_AMBULATORY_CARE_PROVIDER_SITE_OTHER): Payer: Medicare Other | Admitting: Internal Medicine

## 2017-02-07 ENCOUNTER — Other Ambulatory Visit (HOSPITAL_COMMUNITY)
Admission: RE | Admit: 2017-02-07 | Discharge: 2017-02-07 | Disposition: A | Discharge status: Home / Self Care | Payer: Medicare Other | Source: Ambulatory Visit | Attending: Internal Medicine | Admitting: Internal Medicine

## 2017-02-07 VITALS — BP 140/80 | HR 57 | Ht 63.0 in | Wt 130.0 lb

## 2017-02-07 DIAGNOSIS — Z8669 Personal history of other diseases of the nervous system and sense organs: Secondary | ICD-10-CM

## 2017-02-07 DIAGNOSIS — R251 Tremor, unspecified: Secondary | ICD-10-CM | POA: Insufficient documentation

## 2017-02-07 DIAGNOSIS — Z124 Encounter for screening for malignant neoplasm of cervix: Secondary | ICD-10-CM | POA: Diagnosis present

## 2017-02-07 DIAGNOSIS — Z8659 Personal history of other mental and behavioral disorders: Secondary | ICD-10-CM

## 2017-02-07 DIAGNOSIS — Z23 Encounter for immunization: Secondary | ICD-10-CM

## 2017-02-07 DIAGNOSIS — E875 Hyperkalemia: Secondary | ICD-10-CM | POA: Diagnosis not present

## 2017-02-07 DIAGNOSIS — N898 Other specified noninflammatory disorders of vagina: Secondary | ICD-10-CM | POA: Diagnosis not present

## 2017-02-07 DIAGNOSIS — Z0000 Encounter for general adult medical examination without abnormal findings: Secondary | ICD-10-CM | POA: Diagnosis not present

## 2017-02-07 LAB — POCT URINALYSIS DIPSTICK
Bilirubin, UA: NEGATIVE
Glucose, UA: NEGATIVE
Ketones, UA: NEGATIVE
LEUKOCYTES UA: NEGATIVE
NITRITE UA: NEGATIVE
PH UA: 6.5 (ref 5.0–8.0)
Protein, UA: NEGATIVE
RBC UA: NEGATIVE
Spec Grav, UA: 1.01 (ref 1.010–1.025)
UROBILINOGEN UA: 0.2 U/dL

## 2017-02-07 LAB — POTASSIUM: Potassium: 5.2 mmol/L (ref 3.5–5.3)

## 2017-02-07 NOTE — Patient Instructions (Addendum)
Neurology consultation for hand tremor. Repeat potassium today since potassium was slightly elevated at 5.4 and was likely hemolyzed. Calcium is slightly elevated and intact PTH will be checked. Otherwise return in one year or as needed. Have mammogram.
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
## 2017-02-08 LAB — PARATHYROID HORMONE, INTACT (NO CA): PTH: 64 pg/mL (ref 14–64)

## 2017-02-09 LAB — CYTOLOGY - PAP: Diagnosis: NEGATIVE

## 2017-02-10 ENCOUNTER — Telehealth: Payer: Self-pay | Admitting: Internal Medicine

## 2017-02-10 NOTE — Telephone Encounter (Signed)
I do not know what she is talking about. Her lithium level was normal.

## 2017-02-10 NOTE — Telephone Encounter (Signed)
Patient calling to inquire about her Lithium level.  States that she knew it was a little high.  Wants to know if she needs to have that drawn again in a few weeks.  And, wants to know if her levels should be adjusted and if she needs to come in and chat with you about it?  Advised that you sent her something in the mail today and she should receive that tomorrow or Saturday.    Does she need to come back in for a re-draw of Lithium soon?  Or visit?

## 2017-02-11 ENCOUNTER — Other Ambulatory Visit: Payer: Self-pay | Admitting: Internal Medicine

## 2017-02-11 DIAGNOSIS — Z1231 Encounter for screening mammogram for malignant neoplasm of breast: Secondary | ICD-10-CM

## 2017-02-11 NOTE — Telephone Encounter (Addendum)
Patient feels very strongly that she needs to come in and talk with you about the side effects that she's having.  She states that she used to see Dr. Terrial Rhodes but he moved his practice back to [place] about 15 years ago.  I asked her who is following her for the Lithium and she told me that you are following her Lithium.  So, she feels that she's having tremors in the hands and Dr. Terrial Rhodes told her years ago that for best effects for her that she should keep her levels of Lithium around 0.6 in her body and that's why she feels that her Lithium is too high at the present time.    And, that's why she feels that she needs to have her levels drawn again.  She feels that she's having to go through me to get through to you.  And, she feels very strongly that she should have a visit to come in and sit down and talk with you about the side effects that she has researched of people who have been on Lithium for long term.  And, states since you are the one managing her Lithium, she feels she should have this conversation with you really instead of a 2nd hand conversation through someone else to you.    I told her that I would convey this message to you and we would get back to her.    02/17/2017: I have called the patient personally about her concerns. Her lithium level still falls within normal limits. She has appointment to see Dr. Aida Puffer on May 29 regarding tremor. If Dr. Carles Collet feels that this is lithium induced and we will refer her to the psychiatrist to have her lithium adjusted as I do not generally handle lithium prescriptions. She has been very stable on this dose for many years and I have not wanted to change her dosage. The tremor is new however. She is not lithium toxic by any means based on these levels. Patient says she is calm will waiting to see neurologist before making any changes.

## 2017-02-14 ENCOUNTER — Encounter: Payer: Self-pay | Admitting: Neurology

## 2017-03-03 NOTE — Progress Notes (Signed)
Subjective:   Julia Lee was seen in consultation in the movement disorder clinic at the request of Baxley, Cresenciano Lick, MD.  The evaluation is for tremor.  Tremor started approximately 25 years ago, ever since she was started on Lithium.  It really hasn't changed.   She does wonder if lithium needs to be adjusted.   The L hand seems like it may be worse but she is L hand dominant.  The R is also involved.  It is when she uses the hands. There is no known family hx of tremor (dad died at 37 and mom at 74 y/o).    Affected by caffeine: unknown (drinks 3-4 cups/day) Affected by alcohol:  No. (drinks 2 glasses wine/night) Affected by stress:  Yes.   Affected by fatigue:  No. Spills soup if on spoon:  No. Spills glass of liquid if full:  No. Affects ADL's (tying shoes, brushing teeth, etc):  No.  Current/Previously tried tremor medications: n/a  Current medications that may exacerbate tremor:  lithium  Outside reports reviewed: historical medical records, lab reports, office notes and referral letter/letters.  Allergies  Allergen Reactions  . Divalproex Sodium Other (See Comments)    sluggish  . Oxycodone Nausea Only  . Topamax Other (See Comments)    Memory loss    Outpatient Encounter Prescriptions as of 03/08/2017  Medication Sig  . aspirin EC 81 MG tablet Take 81 mg by mouth as needed.  Marland Kitchen b complex vitamins tablet Take 1 tablet by mouth daily.  . Cholecalciferol (VITAMIN D) 1000 UNITS capsule Take 1,000 Units by mouth daily.    Marland Kitchen glucosamine-chondroitin 500-400 MG tablet Take 1 tablet by mouth daily.  Marland Kitchen ibuprofen (ADVIL,MOTRIN) 200 MG tablet Take 200 mg by mouth every 6 (six) hours as needed. For pain  . lithium carbonate (ESKALITH) 450 MG CR tablet TAKE 1 TABLET BY MOUTH EVERY MORNING  . lithium carbonate 300 MG capsule TAKE ONE CAPSULE BY MOUTH ONE TIME DAILY (Patient taking differently: TAKE ONE CAPSULE BY MOUTh at night)  . magnesium oxide (MAG-OX) 400 MG tablet Take 400  mg by mouth daily.  . Multiple Vitamins-Minerals (ZINC PO) Take by mouth daily.  . TURMERIC PO Take by mouth.  . [DISCONTINUED] calcium carbonate (TUMS - DOSED IN MG ELEMENTAL CALCIUM) 500 MG chewable tablet Chew 1 tablet by mouth 2 (two) times daily as needed. For upset stomach  . [DISCONTINUED] lithium carbonate 300 MG capsule TAKE 1 CAPSULE BY MOUTH AT NIGHT  . [DISCONTINUED] OVER THE COUNTER MEDICATION Take 1 tablet by mouth daily. zinc  . [DISCONTINUED] zolmitriptan (ZOMIG) 5 MG nasal solution Place 1 spray into the nose as needed for migraine.   No facility-administered encounter medications on file as of 03/08/2017.     Past Medical History:  Diagnosis Date  . Bipolar disorder ([place])   . Cancer ([place])    rt arm squamous cell ca  . History of migraine headaches   . History of recurrent UTIs     Past Surgical History:  Procedure Laterality Date  . ROTATOR CUFF REPAIR Left   . SKIN CANCER EXCISION    . TOE SURGERY      Social History   Social History  . Marital status: Married    Spouse name: N/A  . Number of children: N/A  . Years of education: N/A   Occupational History  . 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 Smoking  status: Never Smoker  . Smokeless tobacco: Never Used  . Alcohol use 8.4 oz/week    14 Glasses of wine per week     Comment: 2 glasses of wine a night  . Drug use: No  . Sexual activity: Not on file   Other Topics Concern  . Not on file   Social History Narrative  . No narrative on file    Family Status  Relation Status  . Mother Deceased  . Father Deceased  . Sister Deceased       suicide  . Brother Alive  . Daughter Alive    Review of Systems A complete 10 system ROS was obtained and was negative apart from what is mentioned.   Objective:   VITALS:   Vitals:   03/08/17 0828  BP: (!) 148/82  Pulse: 66  SpO2: 99%  Weight: 127 lb (57.6 kg)  Height: 5' 3.75" (1.619 m)   Gen:  Appears  stated age and in NAD. HEENT:  Normocephalic, atraumatic. The mucous membranes are moist. The superficial temporal arteries are without ropiness or tenderness. Cardiovascular: Regular rate and rhythm. Lungs: Clear to auscultation bilaterally. Neck: There are no carotid bruits noted bilaterally.  NEUROLOGICAL:  Orientation:  The patient is alert and oriented x 3.  Recent and remote memory are intact.  Attention span and concentration are normal.  Able to name objects and repeat without trouble.  Fund of knowledge is appropriate Cranial nerves: There is good facial symmetry. The pupils are equal round and reactive to light bilaterally. Fundoscopic exam reveals clear disc margins bilaterally. Extraocular muscles are intact and visual fields are full to confrontational testing. Speech is fluent and clear. Soft palate rises symmetrically and there is no tongue deviation. Hearing is intact to conversational tone. Tone: Tone is good throughout. Sensation: Sensation is intact to light touch and pinprick throughout (facial, trunk, extremities). Vibration is absent at the bilateral big toe and overall somewhat decreased in a distal fashion. There is no extinction with double simultaneous stimulation. There is no sensory dermatomal level identified. Coordination:  The patient has no dysdiadichokinesia or dysmetria. Motor: Strength is 5/5 in the bilateral upper and lower extremities.  Shoulder shrug is equal bilaterally.  There is no pronator drift.  There are no fasciculations noted. DTR's: Deep tendon reflexes are 2+-3/4 at the bilateral biceps, triceps, brachioradialis, patella and achilles.  Plantar responses are downgoing bilaterally. Gait and Station: The patient is able to ambulate without difficulty. The patient is able to heel toe walk without any difficulty. The patient is able to ambulate in a tandem fashion. The patient is able to stand in the Romberg position.   MOVEMENT EXAM: Tremor:  There is  mild tremor in the UE, noted most significantly with action; the L is worse than the right.  It is not worse with a weight in the hand.  The patient is able to draw Archimedes spirals without significant difficulty but tremor is evident especially on the left.  There is no tremor at rest.  The patient is able to pour water from one glass to another without spilling it but tremor is evident.  Labs:    Chemistry      Component Value Date/Time   NA 137 01/31/2017 1303   K 5.2 02/07/2017 1157   CL 106 01/31/2017 1303   CO2 21 01/31/2017 1303   BUN 27 (H) 01/31/2017 1303   CREATININE 0.90 01/31/2017 1303      Component Value Date/Time  CALCIUM 10.5 (H) 01/31/2017 1303   ALKPHOS 48 01/31/2017 1303   AST 18 01/31/2017 1303   ALT 17 01/31/2017 1303   BILITOT 0.5 01/31/2017 1303     Lab Results  Component Value Date   TSH 3.19 01/31/2017   Lab Results  Component Value Date   LITHIUM 0.9 01/31/2017   NA 137 01/31/2017   BUN 27 (H) 01/31/2017   CREATININE 0.90 01/31/2017   TSH 3.19 01/31/2017   WBC 4.8 01/31/2017        Assessment/Plan:   1.  Tremor  -This is likely due to the patient's lithium.  Studies are varied, but incidate that up to 2/3 of patients on lithium will have lithium-induced tremor, even if the patients are not toxic on the medication.  This is just a common side effect of patients on lithium.  I did not recommend that she stop or taper the lithium.  She admits that tremor hasn't really changed in 25 years on the medication.  She wondered if decreasing dose would help and while it may a little, I wouldn't recommend this if tremor hasn't changed in 25 years and if mood has been stable.  I did tell her that in my experience, lithium-induced tremor does not respond well to attempts at treating it with other medications.  She really isn't interested in more medication either.  Much greater than 50% of this visit was spent in counseling and coordinating care.  Total face to  face time:  45 min.  She will f/u with me prn.      CC:  Elby Showers, MD

## 2017-03-08 ENCOUNTER — Ambulatory Visit (INDEPENDENT_AMBULATORY_CARE_PROVIDER_SITE_OTHER): Payer: Medicare Other | Admitting: Neurology

## 2017-03-08 ENCOUNTER — Encounter: Payer: Self-pay | Admitting: Neurology

## 2017-03-08 VITALS — BP 148/82 | HR 66 | Ht 63.75 in | Wt 127.0 lb

## 2017-03-08 DIAGNOSIS — G251 Drug-induced tremor: Secondary | ICD-10-CM | POA: Diagnosis not present

## 2017-03-10 ENCOUNTER — Ambulatory Visit
Admission: RE | Admit: 2017-03-10 | Discharge: 2017-03-10 | Disposition: A | Discharge status: Home / Self Care | Payer: Medicare Other | Source: Ambulatory Visit | Attending: Internal Medicine | Admitting: Internal Medicine

## 2017-03-10 DIAGNOSIS — Z1231 Encounter for screening mammogram for malignant neoplasm of breast: Secondary | ICD-10-CM

## 2017-05-02 ENCOUNTER — Other Ambulatory Visit: Payer: Self-pay | Admitting: Internal Medicine

## 2017-05-23 ENCOUNTER — Telehealth: Payer: Self-pay | Admitting: Internal Medicine

## 2017-05-23 MED ORDER — LITHIUM CARBONATE 300 MG PO CAPS: 300.0000 mg | ORAL_CAPSULE | Freq: Every day | ORAL | 0 refills | Status: DC

## 2017-05-23 MED ORDER — LITHIUM CARBONATE ER 450 MG PO TBCR: 450.0000 mg | EXTENDED_RELEASE_TABLET | Freq: Every morning | ORAL | 0 refills | Status: DC

## 2017-05-23 NOTE — Telephone Encounter (Signed)
Patient is on 2 different doses of lithium. She wants 90 day supply of each called per note from [place]

## 2017-05-23 NOTE — Telephone Encounter (Signed)
Please fix this error by calling pharmacy directly

## 2017-05-23 NOTE — Telephone Encounter (Signed)
Phoned in.

## 2017-05-23 NOTE — Telephone Encounter (Signed)
Please review.  Transmission failed.

## 2017-05-23 NOTE — Telephone Encounter (Signed)
Done and pt aware

## 2017-05-23 NOTE — Telephone Encounter (Signed)
Calling to request refill on Lithium 450 mg and Lithium 300 mg both Rx's.  She would like to have 90 day please.  She is up in [place] until January and would like for her Rx to be called to [facility] up there please.  Pharmacy information listed below.    Pharmacy:  [facility] [phone]  Best number to contact patient:  [phone]

## 2017-05-25 DIAGNOSIS — Z029 Encounter for administrative examinations, unspecified: Secondary | ICD-10-CM

## 2017-08-23 ENCOUNTER — Telehealth: Payer: Self-pay

## 2017-08-23 MED ORDER — LITHIUM CARBONATE ER 450 MG PO TBCR: 450.0000 mg | EXTENDED_RELEASE_TABLET | Freq: Every morning | ORAL | 0 refills | Status: DC

## 2017-08-23 NOTE — Telephone Encounter (Signed)
You will need to call it in as before. Remember it was an issue with dose last time.

## 2017-08-23 NOTE — Telephone Encounter (Signed)
Phoned in to pharmacist, and pt is aware

## 2017-08-23 NOTE — Telephone Encounter (Signed)
Pt is in [place] and would like a refill on her Lithium 450mg  to a pharmacy up there [place] on [address]. Please advise  Call Back# [phone] or [phone]

## 2018-01-18 ENCOUNTER — Other Ambulatory Visit: Payer: Self-pay

## 2018-01-18 DIAGNOSIS — Z1321 Encounter for screening for nutritional disorder: Secondary | ICD-10-CM

## 2018-01-18 DIAGNOSIS — Z0000 Encounter for general adult medical examination without abnormal findings: Secondary | ICD-10-CM

## 2018-01-18 DIAGNOSIS — Z8669 Personal history of other diseases of the nervous system and sense organs: Secondary | ICD-10-CM

## 2018-01-18 DIAGNOSIS — Z1329 Encounter for screening for other suspected endocrine disorder: Secondary | ICD-10-CM

## 2018-01-18 DIAGNOSIS — Z8659 Personal history of other mental and behavioral disorders: Secondary | ICD-10-CM

## 2018-01-22 ENCOUNTER — Other Ambulatory Visit: Payer: Self-pay | Admitting: Internal Medicine

## 2018-01-23 NOTE — Telephone Encounter (Signed)
Please call and refill BOTH doses of lithium

## 2018-02-07 ENCOUNTER — Other Ambulatory Visit: Payer: Medicare Other | Admitting: Internal Medicine

## 2018-02-07 DIAGNOSIS — Z0000 Encounter for general adult medical examination without abnormal findings: Secondary | ICD-10-CM

## 2018-02-07 DIAGNOSIS — Z8669 Personal history of other diseases of the nervous system and sense organs: Secondary | ICD-10-CM

## 2018-02-07 DIAGNOSIS — Z1329 Encounter for screening for other suspected endocrine disorder: Secondary | ICD-10-CM

## 2018-02-07 DIAGNOSIS — Z8659 Personal history of other mental and behavioral disorders: Secondary | ICD-10-CM

## 2018-02-07 DIAGNOSIS — F319 Bipolar disorder, unspecified: Secondary | ICD-10-CM

## 2018-02-07 LAB — LIPID PANEL
CHOLESTEROL: 184 mg/dL (ref <200)
HDL: 68 mg/dL (ref >50)
LDL Cholesterol (Calc): 99 mg/dL (calc)
Non-HDL Cholesterol (Calc): 116 mg/dL (calc) (ref <130)
TRIGLYCERIDES: 80 mg/dL (ref <150)
Total CHOL/HDL Ratio: 2.7 (calc) (ref <5.0)

## 2018-02-07 LAB — COMPLETE METABOLIC PANEL WITH GFR
AG Ratio: 2 (calc) (ref 1.0–2.5)
ALBUMIN MSPROF: 4.5 g/dL (ref 3.6–5.1)
ALT: 11 U/L (ref 6–29)
AST: 15 U/L (ref 10–35)
Alkaline phosphatase (APISO): 50 U/L (ref 33–130)
BILIRUBIN TOTAL: 1 mg/dL (ref 0.2–1.2)
BUN: 23 mg/dL (ref 7–25)
CHLORIDE: 106 mmol/L (ref 98–110)
CO2: 24 mmol/L (ref 20–32)
Calcium: 10.4 mg/dL (ref 8.6–10.4)
Creat: 0.87 mg/dL (ref 0.50–0.99)
GFR, Est African American: 80 mL/min/{1.73_m2} (ref >=60)
GFR, Est Non African American: 69 mL/min/{1.73_m2} (ref >=60)
GLOBULIN: 2.3 g/dL (ref 1.9–3.7)
Glucose, Bld: 109 mg/dL — ABNORMAL HIGH (ref 65–99)
POTASSIUM: 4.7 mmol/L (ref 3.5–5.3)
SODIUM: 138 mmol/L (ref 135–146)
Total Protein: 6.8 g/dL (ref 6.1–8.1)

## 2018-02-07 LAB — CBC WITH DIFFERENTIAL/PLATELET
Basophils Absolute: 82 cells/uL (ref 0–200)
Basophils Relative: 1.9 %
EOS PCT: 6.1 %
Eosinophils Absolute: 262 cells/uL (ref 15–500)
HCT: 39.5 % (ref 35.0–45.0)
Hemoglobin: 13.3 g/dL (ref 11.7–15.5)
Lymphs Abs: 1548 cells/uL (ref 850–3900)
MCH: 32.4 pg (ref 27.0–33.0)
MCHC: 33.7 g/dL (ref 32.0–36.0)
MCV: 96.1 fL (ref 80.0–100.0)
MPV: 11.5 fL (ref 7.5–12.5)
Monocytes Relative: 11.7 %
Neutro Abs: 1905 cells/uL (ref 1500–7800)
Neutrophils Relative %: 44.3 %
PLATELETS: 273 10*3/uL (ref 140–400)
RBC: 4.11 10*6/uL (ref 3.80–5.10)
RDW: 11.4 % (ref 11.0–15.0)
TOTAL LYMPHOCYTE: 36 %
WBC mixed population: 503 cells/uL (ref 200–950)
WBC: 4.3 10*3/uL (ref 3.8–10.8)

## 2018-02-07 LAB — TSH: TSH: 2.97 m[IU]/L (ref 0.40–4.50)

## 2018-02-07 NOTE — Addendum Note (Signed)
Addended by: Mady Haagensen on: 02/07/2018 12:01 PM   Modules accepted: Orders

## 2018-02-08 DIAGNOSIS — M810 Age-related osteoporosis without current pathological fracture: Secondary | ICD-10-CM | POA: Insufficient documentation

## 2018-02-08 LAB — LITHIUM LEVEL: LITHIUM LVL: 0.9 mmol/L (ref 0.6–1.2)

## 2018-02-09 ENCOUNTER — Ambulatory Visit (INDEPENDENT_AMBULATORY_CARE_PROVIDER_SITE_OTHER): Payer: Medicare Other | Admitting: Internal Medicine

## 2018-02-09 ENCOUNTER — Encounter: Payer: Self-pay | Admitting: Gastroenterology

## 2018-02-09 ENCOUNTER — Encounter: Payer: Self-pay | Admitting: Internal Medicine

## 2018-02-09 VITALS — BP 130/78 | HR 84 | Ht 63.0 in | Wt 132.0 lb

## 2018-02-09 DIAGNOSIS — N898 Other specified noninflammatory disorders of vagina: Secondary | ICD-10-CM | POA: Diagnosis not present

## 2018-02-09 DIAGNOSIS — Z8669 Personal history of other diseases of the nervous system and sense organs: Secondary | ICD-10-CM | POA: Diagnosis not present

## 2018-02-09 DIAGNOSIS — Z23 Encounter for immunization: Secondary | ICD-10-CM | POA: Diagnosis not present

## 2018-02-09 DIAGNOSIS — Z8659 Personal history of other mental and behavioral disorders: Secondary | ICD-10-CM

## 2018-02-09 DIAGNOSIS — Z1211 Encounter for screening for malignant neoplasm of colon: Secondary | ICD-10-CM | POA: Diagnosis not present

## 2018-02-09 DIAGNOSIS — Z0000 Encounter for general adult medical examination without abnormal findings: Secondary | ICD-10-CM

## 2018-02-09 LAB — POCT URINALYSIS DIPSTICK
Appearance: NORMAL
Bilirubin, UA: NEGATIVE
Blood, UA: NEGATIVE
GLUCOSE UA: NEGATIVE
Ketones, UA: NEGATIVE
Leukocytes, UA: NEGATIVE
Nitrite, UA: NEGATIVE
ODOR: NORMAL
PROTEIN UA: NEGATIVE
SPEC GRAV UA: 1.015 (ref 1.010–1.025)
Urobilinogen, UA: 0.2 E.U./dL
pH, UA: 6.5 (ref 5.0–8.0)
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
## 2018-02-17 ENCOUNTER — Encounter: Payer: Self-pay | Admitting: Gastroenterology

## 2018-02-17 ENCOUNTER — Ambulatory Visit (AMBULATORY_SURGERY_CENTER): Payer: Self-pay | Admitting: *Deleted

## 2018-02-17 ENCOUNTER — Other Ambulatory Visit: Payer: Self-pay

## 2018-02-17 VITALS — Ht 64.0 in | Wt 131.0 lb

## 2018-02-17 DIAGNOSIS — Z1211 Encounter for screening for malignant neoplasm of colon: Secondary | ICD-10-CM

## 2018-02-17 MED ORDER — NA SULFATE-K SULFATE-MG SULF 17.5-3.13-1.6 GM/177ML PO SOLN: 1.0000 [IU] | Freq: Once | ORAL | 0 refills | Status: AC

## 2018-02-17 NOTE — Progress Notes (Signed)
No egg or soy allergy known to patient  No issues with past sedation with any surgeries  or procedures, no intubation problems  No diet pills per patient No home 02 use per patient  No blood thinners per patient  Pt denies issues with constipation  No A fib or A flutter  EMMI video sent to pt's e mail  

## 2018-03-01 ENCOUNTER — Ambulatory Visit (AMBULATORY_SURGERY_CENTER): Payer: Medicare Other | Admitting: Gastroenterology

## 2018-03-01 ENCOUNTER — Other Ambulatory Visit: Payer: Self-pay

## 2018-03-01 ENCOUNTER — Encounter: Payer: Self-pay | Admitting: Gastroenterology

## 2018-03-01 VITALS — BP 121/60 | HR 66 | Temp 99.1°F | Resp 16 | Ht 64.0 in | Wt 131.0 lb

## 2018-03-01 DIAGNOSIS — Z1211 Encounter for screening for malignant neoplasm of colon: Secondary | ICD-10-CM

## 2018-03-01 MED ORDER — SODIUM CHLORIDE 0.9 % IV SOLN: 500.0000 mL | Freq: Once | INTRAVENOUS | Status: DC

## 2018-03-01 NOTE — Patient Instructions (Signed)
*  Handout given on diverticulosis. Tortuous colon. Repeat colonoscopy in 10 years. Diverticulosis on left side of colon.  YOU HAD AN ENDOSCOPIC PROCEDURE TODAY AT [facility]:   Refer to the procedure report that was given to you for any specific questions about what was found during the examination.  If the procedure report does not answer your questions, please call your gastroenterologist to clarify.  If you requested that your care partner not be given the details of your procedure findings, then the procedure report has been included in a sealed envelope for you to review at your convenience later.  YOU SHOULD EXPECT: Some feelings of bloating in the abdomen. Passage of more gas than usual.  Walking can help get rid of the air that was put into your GI tract during the procedure and reduce the bloating. If you had a lower endoscopy (such as a colonoscopy or flexible sigmoidoscopy) you may notice spotting of blood in your stool or on the toilet paper. If you underwent a bowel prep for your procedure, you may not have a normal bowel movement for a few days.  Please Note:  You might notice some irritation and congestion in your nose or some drainage.  This is from the oxygen used during your procedure.  There is no need for concern and it should clear up in a day or so.  SYMPTOMS TO REPORT IMMEDIATELY:   Following lower endoscopy (colonoscopy or flexible sigmoidoscopy):  Excessive amounts of blood in the stool  Significant tenderness or worsening of abdominal pains  Swelling of the abdomen that is new, acute  Fever of 100F or higher    DIET:  We do recommend a small meal at first, but then you may proceed to your regular diet.  Drink plenty of fluids but you should avoid alcoholic beverages for 24 hours.  ACTIVITY:  You should plan to take it easy for the rest of today and you should NOT DRIVE or use heavy machinery until tomorrow (because of the sedation medicines used  during the test).    FOLLOW UP: Our staff will call the number listed on your records the next business day following your procedure to check on you and address any questions or concerns that you may have regarding the information given to you following your procedure. If we do not reach you, we will leave a message.  However, if you are feeling well and you are not experiencing any problems, there is no need to return our call.  We will assume that you have returned to your regular daily activities without incident.  If any biopsies were taken you will be contacted by phone or by letter within the next 1-3 weeks.  Please call us at [phone] if you have not heard about the biopsies in 3 weeks.    SIGNATURES/CONFIDENTIALITY: You and/or your care partner have signed paperwork which will be entered into your electronic medical record.  These signatures attest to the fact that that the information above on your After Visit Summary has been reviewed and is understood.  Full responsibility of the confidentiality of this discharge information lies with you and/or your care-partner.

## 2018-03-01 NOTE — Progress Notes (Signed)
Pt's states no medical or surgical changes since previsit or office visit. 

## 2018-03-01 NOTE — Op Note (Signed)
[place] Patient Name: Julia Lee Procedure Date: 03/01/2018 7:53 AM MRN: [id number] Endoscopist: Remo Lipps P. Havery Moros , MD Age: 67 Referring MD:  Date of Birth: 14-Nov-1950 Gender: Female Account #: [id number] Procedure:                Colonoscopy Indications:              Screening for colorectal malignant neoplasm, This                            is the patient's first colonoscopy Medicines:                Monitored Anesthesia Care Procedure:                Pre-Anesthesia Assessment:                           - Prior to the procedure, a History and Physical                            was performed, and patient medications and                            allergies were reviewed. The patient's tolerance of                            previous anesthesia was also reviewed. The risks                            and benefits of the procedure and the sedation                            options and risks were discussed with the patient.                            All questions were answered, and informed consent                            was obtained. Prior Anticoagulants: The patient has                            taken no previous anticoagulant or antiplatelet                            agents. ASA Grade Assessment: II - A patient with                            mild systemic disease. After reviewing the risks                            and benefits, the patient was deemed in                            satisfactory condition to undergo the procedure.  After obtaining informed consent, the colonoscope                            was passed under direct vision. Throughout the                            procedure, the patient's blood pressure, pulse, and                            oxygen saturations were monitored continuously. The                            Model PCF-H190DL [id number]) scope was introduced                            through the anus  and advanced to the the cecum,                            identified by appendiceal orifice and ileocecal                            valve. The colonoscopy was performed without                            difficulty. The patient tolerated the procedure                            well. The quality of the bowel preparation was                            adequate. The ileocecal valve, appendiceal orifice,                            and rectum were photographed. Scope In: 8:04:38 AM Scope Out: 8:31:48 AM Scope Withdrawal Time: 0 hours 21 minutes 42 seconds  Total Procedure Duration: 0 hours 27 minutes 10 seconds  Findings:                 The perianal and digital rectal examinations were                            normal.                           Multiple small-mouthed diverticula were found in                            the sigmoid colon.                           The colon was severely tortuous which prolonged the                            exam.  The exam was otherwise without abnormality on                            direct and retroflexion views. Complications:            No immediate complications. Estimated blood loss:                            None. Estimated Blood Loss:     Estimated blood loss: none. Impression:               - Diverticulosis in the sigmoid colon.                           - Severely tortous colon.                           - The examination was otherwise normal on direct                            and retroflexion views.                           - No polyps Recommendation:           - Patient has a contact number available for                            emergencies. The signs and symptoms of potential                            delayed complications were discussed with the                            patient. Return to normal activities tomorrow.                            Written discharge instructions were provided to the                             patient.                           - Resume previous diet.                           - Continue present medications.                           - Repeat colonoscopy in 10 years for screening                            purposes. Remo Lipps P. Armbruster, MD 03/01/2018 8:36:55 AM This report has been signed electronically.

## 2018-03-01 NOTE — Progress Notes (Signed)
A/ox3 pleased with MAC, report to RN 

## 2018-03-02 ENCOUNTER — Telehealth: Payer: Self-pay

## 2018-03-02 NOTE — Telephone Encounter (Signed)
  Follow up Call-  Call back number 03/01/2018  Post procedure Call Back phone  # [phone]  Permission to leave phone message Yes  Some recent data might be hidden     Patient questions:  Do you have a fever, pain , or abdominal swelling? No. Pain Score  0 *  Have you tolerated food without any problems? Yes.    Have you been able to return to your normal activities? Yes.    Do you have any questions about your discharge instructions: Diet   No. Medications  No. Follow up visit  No.  Do you have questions or concerns about your Care? No.  Actions: * If pain score is 4 or above: No action needed, pain <4.

## 2018-03-05 NOTE — Patient Instructions (Addendum)
It was a pleasure to see you today.  Colonoscopy discussed and referral made.  Continue to receive annual mammograms.  Continue diet and exercise and follow-up in 1 year.  Pneumococcal 23 given.  Consider Shingrix vaccine.

## 2018-05-06 ENCOUNTER — Other Ambulatory Visit: Payer: Self-pay | Admitting: Internal Medicine

## 2018-08-03 ENCOUNTER — Encounter: Payer: Self-pay | Admitting: Internal Medicine

## 2018-08-09 ENCOUNTER — Other Ambulatory Visit: Payer: Self-pay | Admitting: Internal Medicine

## 2018-08-11 ENCOUNTER — Other Ambulatory Visit: Payer: Self-pay | Admitting: Internal Medicine

## 2018-08-18 ENCOUNTER — Telehealth: Payer: Self-pay | Admitting: Internal Medicine

## 2018-08-18 NOTE — Telephone Encounter (Signed)
There is one study that suggested one dose of doxycycline 200 mg to prevent Lyme disease. That has not been our policy.there are other tick borne illnesses.
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
## 2018-08-18 NOTE — Telephone Encounter (Signed)
Returned the call to patient.  Explained the protocol that Dr. Renold Genta advised below.  She will wait the time out and watch for the symptoms and bulls eye, etc.  And, if she needs to call back to be seen, she will do so.  Patient verbalized understanding of the policy and our conversation.

## 2018-11-12 ENCOUNTER — Other Ambulatory Visit: Payer: Self-pay | Admitting: Internal Medicine

## 2018-12-21 ENCOUNTER — Other Ambulatory Visit: Payer: Self-pay | Admitting: Internal Medicine

## 2019-02-06 ENCOUNTER — Telehealth: Payer: Self-pay | Admitting: Internal Medicine

## 2019-02-06 NOTE — Telephone Encounter (Signed)
LVM to CB and schedule CPE, AWV and Labs

## 2019-04-26 ENCOUNTER — Other Ambulatory Visit: Payer: Self-pay

## 2019-04-27 ENCOUNTER — Other Ambulatory Visit: Payer: Self-pay

## 2019-04-27 MED ORDER — LITHIUM CARBONATE 300 MG PO CAPS: ORAL_CAPSULE | ORAL | 1 refills | Status: DC

## 2019-04-27 MED ORDER — LITHIUM CARBONATE ER 450 MG PO TBCR: EXTENDED_RELEASE_TABLET | ORAL | 1 refills | Status: DC

## 2019-09-19 NOTE — Telephone Encounter (Signed)
Sent MyChart message to call office and schedule CPE and Labs

## 2019-10-23 ENCOUNTER — Telehealth: Payer: Self-pay | Admitting: Internal Medicine

## 2019-10-23 MED ORDER — LITHIUM CARBONATE ER 450 MG PO TBCR: EXTENDED_RELEASE_TABLET | ORAL | 0 refills | Status: DC

## 2019-10-23 NOTE — Telephone Encounter (Signed)
Appointment scheduled.

## 2019-10-23 NOTE — Telephone Encounter (Signed)
Received Fax RX request from  [facility], [place] Phone:  [phone]  Fax:  [phone]       Medication - lithium carbonate (ESKALITH) 450 MG CR tablet   Last Refill -   Last OV - 02/09/2018  Last CPE - 02/09/2018  Called patient to schedule CPE, since she had not been seen since May 2019, let her know we could possible refill medication to get her by till her upcoming appointment that we scheduled.  Next Appointment - 11/12/2019

## 2019-11-06 ENCOUNTER — Ambulatory Visit: Payer: Medicare Other

## 2019-11-09 ENCOUNTER — Other Ambulatory Visit: Payer: Self-pay

## 2019-11-09 ENCOUNTER — Other Ambulatory Visit: Payer: Medicare PPO | Admitting: Internal Medicine

## 2019-11-09 DIAGNOSIS — Z1329 Encounter for screening for other suspected endocrine disorder: Secondary | ICD-10-CM

## 2019-11-09 DIAGNOSIS — Z13220 Encounter for screening for lipoid disorders: Secondary | ICD-10-CM

## 2019-11-09 DIAGNOSIS — Z8659 Personal history of other mental and behavioral disorders: Secondary | ICD-10-CM | POA: Diagnosis not present

## 2019-11-09 DIAGNOSIS — E785 Hyperlipidemia, unspecified: Secondary | ICD-10-CM | POA: Diagnosis not present

## 2019-11-09 DIAGNOSIS — E7800 Pure hypercholesterolemia, unspecified: Secondary | ICD-10-CM | POA: Diagnosis not present

## 2019-11-09 DIAGNOSIS — Z0000 Encounter for general adult medical examination without abnormal findings: Secondary | ICD-10-CM

## 2019-11-09 LAB — CBC WITH DIFFERENTIAL/PLATELET
Absolute Monocytes: 486 cells/uL (ref 200–950)
Basophils Absolute: 90 cells/uL (ref 0–200)
Basophils Relative: 2.1 %
Eosinophils Absolute: 249 cells/uL (ref 15–500)
Eosinophils Relative: 5.8 %
HCT: 40.7 % (ref 35.0–45.0)
Hemoglobin: 13.8 g/dL (ref 11.7–15.5)
Lymphs Abs: 1458 cells/uL (ref 850–3900)
MCH: 32.5 pg (ref 27.0–33.0)
MCHC: 33.9 g/dL (ref 32.0–36.0)
MCV: 96 fL (ref 80.0–100.0)
MPV: 11 fL (ref 7.5–12.5)
Monocytes Relative: 11.3 %
Neutro Abs: 2017 cells/uL (ref 1500–7800)
Neutrophils Relative %: 46.9 %
Platelets: 265 10*3/uL (ref 140–400)
RBC: 4.24 10*6/uL (ref 3.80–5.10)
RDW: 11.6 % (ref 11.0–15.0)
Total Lymphocyte: 33.9 %
WBC: 4.3 10*3/uL (ref 3.8–10.8)

## 2019-11-09 LAB — COMPLETE METABOLIC PANEL WITH GFR
AG Ratio: 2.4 (calc) (ref 1.0–2.5)
ALT: 16 U/L (ref 6–29)
AST: 17 U/L (ref 10–35)
Albumin: 4.5 g/dL (ref 3.6–5.1)
Alkaline phosphatase (APISO): 46 U/L (ref 37–153)
BUN: 24 mg/dL (ref 7–25)
CO2: 25 mmol/L (ref 20–32)
Calcium: 10.8 mg/dL — ABNORMAL HIGH (ref 8.6–10.4)
Chloride: 108 mmol/L (ref 98–110)
Creat: 0.88 mg/dL (ref 0.50–0.99)
GFR, Est African American: 79 mL/min/{1.73_m2} (ref >=60)
GFR, Est Non African American: 68 mL/min/{1.73_m2} (ref >=60)
Globulin: 1.9 g/dL (calc) (ref 1.9–3.7)
Glucose, Bld: 107 mg/dL — ABNORMAL HIGH (ref 65–99)
Potassium: 5.6 mmol/L — ABNORMAL HIGH (ref 3.5–5.3)
Sodium: 138 mmol/L (ref 135–146)
Total Bilirubin: 0.4 mg/dL (ref 0.2–1.2)
Total Protein: 6.4 g/dL (ref 6.1–8.1)

## 2019-11-09 LAB — LIPID PANEL
Cholesterol: 203 mg/dL — ABNORMAL HIGH (ref <200)
HDL: 76 mg/dL (ref >=50)
LDL Cholesterol (Calc): 110 mg/dL (calc) — ABNORMAL HIGH
Non-HDL Cholesterol (Calc): 127 mg/dL (calc) (ref <130)
Total CHOL/HDL Ratio: 2.7 (calc) (ref <5.0)
Triglycerides: 76 mg/dL (ref <150)

## 2019-11-09 LAB — TSH: TSH: 2.05 mIU/L (ref 0.40–4.50)

## 2019-11-12 ENCOUNTER — Encounter: Payer: Self-pay | Admitting: Internal Medicine

## 2019-11-12 ENCOUNTER — Ambulatory Visit (INDEPENDENT_AMBULATORY_CARE_PROVIDER_SITE_OTHER): Payer: Medicare PPO | Admitting: Internal Medicine

## 2019-11-12 ENCOUNTER — Other Ambulatory Visit: Payer: Self-pay

## 2019-11-12 VITALS — BP 140/90 | HR 62 | Temp 98.0°F | Ht 64.0 in | Wt 138.0 lb

## 2019-11-12 DIAGNOSIS — Z0000 Encounter for general adult medical examination without abnormal findings: Secondary | ICD-10-CM

## 2019-11-12 DIAGNOSIS — Z8659 Personal history of other mental and behavioral disorders: Secondary | ICD-10-CM

## 2019-11-12 DIAGNOSIS — M65331 Trigger finger, right middle finger: Secondary | ICD-10-CM | POA: Diagnosis not present

## 2019-11-12 DIAGNOSIS — M65342 Trigger finger, left ring finger: Secondary | ICD-10-CM | POA: Diagnosis not present

## 2019-11-12 LAB — POCT URINALYSIS DIPSTICK
Appearance: NEGATIVE
Bilirubin, UA: NEGATIVE
Blood, UA: NEGATIVE
Glucose, UA: NEGATIVE
Ketones, UA: NEGATIVE
Leukocytes, UA: NEGATIVE
Nitrite, UA: NEGATIVE
Odor: NEGATIVE
Protein, UA: NEGATIVE
Spec Grav, UA: 1.015 (ref 1.010–1.025)
Urobilinogen, UA: 0.2 E.U./dL
pH, UA: 6.5 (ref 5.0–8.0)

## 2019-11-12 MED ORDER — NADOLOL 20 MG PO TABS: 20.0000 mg | ORAL_TABLET | Freq: Every day | ORAL | 0 refills | Status: DC
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
## 2019-11-15 ENCOUNTER — Other Ambulatory Visit: Payer: Self-pay | Admitting: Internal Medicine

## 2019-11-15 ENCOUNTER — Other Ambulatory Visit: Payer: Self-pay

## 2019-11-15 ENCOUNTER — Ambulatory Visit: Payer: Medicare PPO | Attending: Internal Medicine

## 2019-11-15 ENCOUNTER — Other Ambulatory Visit: Payer: Medicare PPO | Admitting: Internal Medicine

## 2019-11-15 DIAGNOSIS — Z23 Encounter for immunization: Secondary | ICD-10-CM

## 2019-11-15 DIAGNOSIS — Z0000 Encounter for general adult medical examination without abnormal findings: Secondary | ICD-10-CM | POA: Diagnosis not present

## 2019-11-15 DIAGNOSIS — F319 Bipolar disorder, unspecified: Secondary | ICD-10-CM

## 2019-11-15 DIAGNOSIS — Z8659 Personal history of other mental and behavioral disorders: Secondary | ICD-10-CM

## 2019-11-15 NOTE — Progress Notes (Signed)
   Covid-19 Vaccination Clinic  Name:  Julia Lee    MRN: [id number] DOB: [birth year]  11/15/2019  [name] was observed post Covid-19 immunization for 15 minutes without incidence. She was provided with Vaccine Information Sheet and instruction to access the V-Safe system.   [name] was instructed to call 911 with any severe reactions post vaccine: Marland Kitchen Difficulty breathing  . Swelling of your face and throat  . A fast heartbeat  . A bad rash all over your body  . Dizziness and weakness    Immunizations Administered    Name Date Dose VIS Date Route   Pfizer COVID-19 Vaccine 11/15/2019  4:22 PM 0.3 mL 09/21/2019 Intramuscular   Manufacturer: [place]   Lot: [id number]   [place]: [id number]

## 2019-11-15 NOTE — Addendum Note (Signed)
Addended by: Mady Haagensen on: 11/15/2019 09:23 AM   Modules accepted: Orders

## 2019-11-16 LAB — LITHIUM LEVEL: Lithium Lvl: 0.9 mmol/L (ref 0.6–1.2)

## 2019-11-16 LAB — CYCLIC CITRUL PEPTIDE ANTIBODY, IGG: Cyclic Citrullin Peptide Ab: <16 UNITS

## 2019-11-16 LAB — PARATHYROID HORMONE, INTACT (NO CA): PTH: 47 pg/mL (ref 14–64)

## 2019-11-23 ENCOUNTER — Ambulatory Visit: Payer: Medicare Other

## 2019-12-02 NOTE — Patient Instructions (Addendum)
It was a pleasure to see you today.  Serum calcium is elevated but no evidence of parathyroid hormone tumor.  See Dr. Fredna Dow regarding trigger fingers.  Continue current medications as previously prescribed.  Lithium level drawn.  Return in 1 year or as needed.

## 2019-12-05 ENCOUNTER — Other Ambulatory Visit: Payer: Self-pay | Admitting: Internal Medicine

## 2019-12-05 DIAGNOSIS — Z0000 Encounter for general adult medical examination without abnormal findings: Secondary | ICD-10-CM

## 2019-12-05 DIAGNOSIS — Z8659 Personal history of other mental and behavioral disorders: Secondary | ICD-10-CM

## 2019-12-10 ENCOUNTER — Ambulatory Visit: Payer: Medicare PPO | Attending: Internal Medicine

## 2019-12-10 DIAGNOSIS — Z23 Encounter for immunization: Secondary | ICD-10-CM | POA: Insufficient documentation

## 2019-12-10 NOTE — Progress Notes (Signed)
   Covid-19 Vaccination Clinic  Name:  Julia Lee    MRN: [id number] DOB: Jan 15, 1951  12/10/2019  [name] was observed post Covid-19 immunization for 15 minutes without incidence. She was provided with Vaccine Information Sheet and instruction to access the V-Safe system.   [name] was instructed to call 911 with any severe reactions post vaccine: Marland Kitchen Difficulty breathing  . Swelling of your face and throat  . A fast heartbeat  . A bad rash all over your body  . Dizziness and weakness    Immunizations Administered    Name Date Dose VIS Date Route   Pfizer COVID-19 Vaccine 12/10/2019  2:48 PM 0.3 mL 09/21/2019 Intramuscular   Manufacturer: [place]   Lot: [id number]   [place]: [id number]

## 2019-12-26 DIAGNOSIS — L570 Actinic keratosis: Secondary | ICD-10-CM | POA: Diagnosis not present

## 2019-12-26 DIAGNOSIS — D0462 Carcinoma in situ of skin of left upper limb, including shoulder: Secondary | ICD-10-CM | POA: Diagnosis not present

## 2019-12-26 DIAGNOSIS — Z85828 Personal history of other malignant neoplasm of skin: Secondary | ICD-10-CM | POA: Diagnosis not present

## 2020-01-16 DIAGNOSIS — M79641 Pain in right hand: Secondary | ICD-10-CM | POA: Diagnosis not present

## 2020-01-26 ENCOUNTER — Other Ambulatory Visit: Payer: Self-pay | Admitting: Internal Medicine

## 2020-04-27 ENCOUNTER — Other Ambulatory Visit: Payer: Self-pay | Admitting: Internal Medicine

## 2020-07-14 ENCOUNTER — Other Ambulatory Visit: Payer: Self-pay | Admitting: Internal Medicine

## 2020-07-14 DIAGNOSIS — Z1231 Encounter for screening mammogram for malignant neoplasm of breast: Secondary | ICD-10-CM

## 2020-08-06 ENCOUNTER — Telehealth: Payer: Self-pay

## 2020-08-06 ENCOUNTER — Other Ambulatory Visit: Payer: Self-pay | Admitting: Internal Medicine

## 2020-08-06 MED ORDER — ZOLMITRIPTAN 5 MG NA SOLN: 1.0000 | NASAL | 0 refills | Status: DC | PRN

## 2020-08-06 NOTE — Telephone Encounter (Signed)
Patient called she started having a headache at midnight last night, she vomited around 6am, no diarrhea no fever. She did a COVID test at home and it was negative. She said you used to prescribe zomig for her and would like to know if you can send that in for her she said she is willing to do a video visit.

## 2020-08-06 NOTE — Telephone Encounter (Signed)
Patient called she has started the zomig and she feels much better. She paid out of pocket for it but next time she would like the imitrex spray. Dr. Renold Genta aware.

## 2020-08-14 ENCOUNTER — Other Ambulatory Visit: Payer: Self-pay | Admitting: Internal Medicine

## 2020-08-20 ENCOUNTER — Ambulatory Visit
Admission: RE | Admit: 2020-08-20 | Discharge: 2020-08-20 | Disposition: A | Discharge status: Home / Self Care | Payer: Medicare PPO | Source: Ambulatory Visit | Attending: Internal Medicine | Admitting: Internal Medicine

## 2020-08-20 ENCOUNTER — Other Ambulatory Visit: Payer: Self-pay

## 2020-08-20 DIAGNOSIS — Z1231 Encounter for screening mammogram for malignant neoplasm of breast: Secondary | ICD-10-CM

## 2020-10-30 DIAGNOSIS — Z20822 Contact with and (suspected) exposure to covid-19: Secondary | ICD-10-CM | POA: Diagnosis not present

## 2020-11-12 ENCOUNTER — Other Ambulatory Visit: Payer: Self-pay | Admitting: Internal Medicine

## 2020-11-12 NOTE — Telephone Encounter (Signed)
Tried to calle her 3 times no answer, left voicemail to call me back.

## 2020-11-12 NOTE — Telephone Encounter (Signed)
Please book CPE before refilling. Last done early Feb 2021. We have an agreement that she will come annually.

## 2020-12-31 DIAGNOSIS — M25562 Pain in left knee: Secondary | ICD-10-CM | POA: Diagnosis not present

## 2020-12-31 DIAGNOSIS — M545 Low back pain, unspecified: Secondary | ICD-10-CM | POA: Diagnosis not present

## 2020-12-31 DIAGNOSIS — M25552 Pain in left hip: Secondary | ICD-10-CM | POA: Diagnosis not present

## 2021-01-13 DIAGNOSIS — M5416 Radiculopathy, lumbar region: Secondary | ICD-10-CM | POA: Diagnosis not present

## 2021-01-26 DIAGNOSIS — M5416 Radiculopathy, lumbar region: Secondary | ICD-10-CM | POA: Diagnosis not present

## 2021-02-03 DIAGNOSIS — M5416 Radiculopathy, lumbar region: Secondary | ICD-10-CM | POA: Diagnosis not present

## 2021-02-09 DIAGNOSIS — M5416 Radiculopathy, lumbar region: Secondary | ICD-10-CM | POA: Diagnosis not present

## 2021-02-11 DIAGNOSIS — M25552 Pain in left hip: Secondary | ICD-10-CM | POA: Diagnosis not present

## 2021-02-12 ENCOUNTER — Other Ambulatory Visit: Payer: Self-pay | Admitting: Internal Medicine

## 2021-02-13 ENCOUNTER — Other Ambulatory Visit: Payer: Self-pay | Admitting: Internal Medicine

## 2021-02-17 ENCOUNTER — Other Ambulatory Visit: Payer: Medicare PPO | Admitting: Internal Medicine

## 2021-02-17 ENCOUNTER — Other Ambulatory Visit: Payer: Self-pay

## 2021-02-17 DIAGNOSIS — E785 Hyperlipidemia, unspecified: Secondary | ICD-10-CM | POA: Diagnosis not present

## 2021-02-17 DIAGNOSIS — F319 Bipolar disorder, unspecified: Secondary | ICD-10-CM

## 2021-02-17 DIAGNOSIS — Z0000 Encounter for general adult medical examination without abnormal findings: Secondary | ICD-10-CM

## 2021-02-18 LAB — CBC WITH DIFFERENTIAL/PLATELET
Absolute Monocytes: 706 cells/uL (ref 200–950)
Basophils Absolute: 38 cells/uL (ref 0–200)
Basophils Relative: 0.6 %
Eosinophils Absolute: 50 cells/uL (ref 15–500)
Eosinophils Relative: 0.8 %
HCT: 41.3 % (ref 35.0–45.0)
Hemoglobin: 13.4 g/dL (ref 11.7–15.5)
Lymphs Abs: 1399 cells/uL (ref 850–3900)
MCH: 31.6 pg (ref 27.0–33.0)
MCHC: 32.4 g/dL (ref 32.0–36.0)
MCV: 97.4 fL (ref 80.0–100.0)
MPV: 11.4 fL (ref 7.5–12.5)
Monocytes Relative: 11.2 %
Neutro Abs: 4108 cells/uL (ref 1500–7800)
Neutrophils Relative %: 65.2 %
Platelets: 296 10*3/uL (ref 140–400)
RBC: 4.24 10*6/uL (ref 3.80–5.10)
RDW: 11.6 % (ref 11.0–15.0)
Total Lymphocyte: 22.2 %
WBC: 6.3 10*3/uL (ref 3.8–10.8)

## 2021-02-18 LAB — COMPLETE METABOLIC PANEL WITH GFR
AG Ratio: 1.9 (calc) (ref 1.0–2.5)
ALT: 11 U/L (ref 6–29)
AST: 11 U/L (ref 10–35)
Albumin: 4.4 g/dL (ref 3.6–5.1)
Alkaline phosphatase (APISO): 46 U/L (ref 37–153)
BUN: 23 mg/dL (ref 7–25)
CO2: 26 mmol/L (ref 20–32)
Calcium: 10.8 mg/dL — ABNORMAL HIGH (ref 8.6–10.4)
Chloride: 105 mmol/L (ref 98–110)
Creat: 0.9 mg/dL (ref 0.50–0.99)
GFR, Est African American: 76 mL/min/{1.73_m2} (ref >=60)
GFR, Est Non African American: 65 mL/min/{1.73_m2} (ref >=60)
Globulin: 2.3 g/dL (calc) (ref 1.9–3.7)
Glucose, Bld: 98 mg/dL (ref 65–99)
Potassium: 5.6 mmol/L — ABNORMAL HIGH (ref 3.5–5.3)
Sodium: 136 mmol/L (ref 135–146)
Total Bilirubin: 0.8 mg/dL (ref 0.2–1.2)
Total Protein: 6.7 g/dL (ref 6.1–8.1)

## 2021-02-18 LAB — LIPID PANEL
Cholesterol: 212 mg/dL — ABNORMAL HIGH (ref <200)
HDL: 86 mg/dL (ref >=50)
LDL Cholesterol (Calc): 110 mg/dL (calc) — ABNORMAL HIGH
Non-HDL Cholesterol (Calc): 126 mg/dL (calc) (ref <130)
Total CHOL/HDL Ratio: 2.5 (calc) (ref <5.0)
Triglycerides: 71 mg/dL (ref <150)

## 2021-02-18 LAB — TSH: TSH: 2.96 mIU/L (ref 0.40–4.50)

## 2021-02-18 LAB — LITHIUM LEVEL: Lithium Lvl: 0.8 mmol/L (ref 0.6–1.2)

## 2021-02-20 ENCOUNTER — Encounter: Payer: Self-pay | Admitting: Internal Medicine

## 2021-02-20 ENCOUNTER — Other Ambulatory Visit: Payer: Self-pay

## 2021-02-20 ENCOUNTER — Ambulatory Visit (INDEPENDENT_AMBULATORY_CARE_PROVIDER_SITE_OTHER): Payer: Medicare PPO | Admitting: Internal Medicine

## 2021-02-20 VITALS — BP 130/90 | HR 60 | Ht 63.0 in | Wt 133.0 lb

## 2021-02-20 DIAGNOSIS — Z0000 Encounter for general adult medical examination without abnormal findings: Secondary | ICD-10-CM | POA: Diagnosis not present

## 2021-02-20 DIAGNOSIS — M19041 Primary osteoarthritis, right hand: Secondary | ICD-10-CM

## 2021-02-20 DIAGNOSIS — R251 Tremor, unspecified: Secondary | ICD-10-CM

## 2021-02-20 DIAGNOSIS — E875 Hyperkalemia: Secondary | ICD-10-CM | POA: Diagnosis not present

## 2021-02-20 DIAGNOSIS — M5416 Radiculopathy, lumbar region: Secondary | ICD-10-CM | POA: Diagnosis not present

## 2021-02-20 DIAGNOSIS — M19042 Primary osteoarthritis, left hand: Secondary | ICD-10-CM | POA: Diagnosis not present

## 2021-02-20 DIAGNOSIS — Z8659 Personal history of other mental and behavioral disorders: Secondary | ICD-10-CM

## 2021-02-20 LAB — POTASSIUM: Potassium: 5 mmol/L (ref 3.5–5.3)

## 2021-02-20 LAB — POCT URINALYSIS DIPSTICK
Appearance: NEGATIVE
Bilirubin, UA: NEGATIVE
Blood, UA: NEGATIVE
Glucose, UA: NEGATIVE
Ketones, UA: NEGATIVE
Leukocytes, UA: NEGATIVE
Nitrite, UA: NEGATIVE
Odor: NEGATIVE
Protein, UA: NEGATIVE
Spec Grav, UA: 1.015 (ref 1.010–1.025)
Urobilinogen, UA: 0.2 E.U./dL
pH, UA: 6 (ref 5.0–8.0)
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
## 2021-02-23 LAB — CALCIUM: Calcium: 11.1 mg/dL — ABNORMAL HIGH (ref 8.6–10.4)

## 2021-02-23 LAB — PARATHYROID HORMONE, INTACT (NO CA): PTH: 70 pg/mL (ref 16–77)

## 2021-02-27 DIAGNOSIS — K130 Diseases of lips: Secondary | ICD-10-CM | POA: Diagnosis not present

## 2021-03-01 NOTE — Patient Instructions (Signed)
It was a pleasure to see you today.  You do not have a parathyroid adenoma based on recent testing.  However your serum calcium remains elevated and we will asked that an endocrinologist see you for an opinion regarding this.  Lithium has controlled bipolar symptoms well for many years.  I would like to be able to continue that if endocrinology thinks that the elevated calcium is minor.  We did note a bilateral hand tremor persist and Dr. Carles Collet thought that was due to lithium therapy.  Her COVID vaccines are up-to-date.  Colonoscopy is up-to-date.  Please continue with annual mammograms.  Return in 1 year.

## 2021-03-13 ENCOUNTER — Telehealth: Payer: Self-pay | Admitting: Internal Medicine

## 2021-03-13 NOTE — Telephone Encounter (Signed)
Dr. Zollie Beckers

## 2021-03-13 NOTE — Telephone Encounter (Signed)
Julia Lee [phone]  Caren Griffins called to see who you would recommend for ortho, she did see the father at [facility], that has retired and now sees the son and is not satisfied.

## 2021-03-13 NOTE — Telephone Encounter (Signed)
LVM of Dr Zollie Beckers

## 2021-03-18 ENCOUNTER — Encounter: Payer: Self-pay | Admitting: Endocrinology

## 2021-03-18 ENCOUNTER — Ambulatory Visit: Payer: Medicare PPO | Admitting: Endocrinology

## 2021-03-18 ENCOUNTER — Other Ambulatory Visit: Payer: Self-pay

## 2021-03-18 VITALS — BP 132/92 | HR 62 | Ht 63.25 in | Wt 136.4 lb

## 2021-03-18 DIAGNOSIS — E213 Hyperparathyroidism, unspecified: Secondary | ICD-10-CM

## 2021-03-18 DIAGNOSIS — R251 Tremor, unspecified: Secondary | ICD-10-CM | POA: Diagnosis not present

## 2021-03-18 NOTE — Progress Notes (Signed)
Patient ID: Julia Lee, female   DOB: 1951/05/10, 70 y.o.   MRN: [id number]           Chief complaint: High calcium  History of Present Illness:  Referring physician: Tedra Senegal   Review of records show that she has had a high calcium since 2012. This has been fairly consistently under 11.0  Lab Results  Component Value Date   CALCIUM 11.1 (H) 02/20/2021   CALCIUM 10.8 (H) 02/17/2021   CALCIUM 10.8 (H) 11/09/2019   CALCIUM 10.4 02/07/2018   CALCIUM 10.5 (H) 01/31/2017   CALCIUM 10.6 (H) 02/24/2016   CALCIUM 10.6 (H) 02/26/2015   CALCIUM 10.6 (H) 03/30/2011    The hypercalcemia is not associated with any history of fractures, renal insufficiency, history of kidney stones or known lung disease, active carcinoma or thyroid disease  She thinks she may have lost only less than an inch in height, previously was 5 feet 3.75 inches tall She says she feels fairly good overall except for issues with her tremor and some joint pains  Age at menopause was 22-50; no previous history of HRT  Prior serologic and radiologic studies have included:  Lab Results  Component Value Date   PTH 70 02/20/2021   CALCIUM 11.1 (H) 02/20/2021   CAION 1.36 (H) 06/18/2012      25 (OH) Vitamin D level on 2000 U daily  Lab Results  Component Value Date   VD25OH 70 01/31/2017   VD25OH 61 02/24/2016   No bone density studies are available on chart   Allergies as of 03/18/2021      Reactions   Divalproex Sodium Other (See Comments)   sluggish   Oxycodone Nausea Only   Topamax Other (See Comments)   Memory loss      Medication List       Accurate as of March 18, 2021  9:08 AM. If you have any questions, ask your nurse or doctor.        aspirin EC 325 MG tablet Take 650 mg by mouth as needed.   b complex vitamins tablet Take 1 tablet by mouth daily.   Fish Oil 1000 MG Caps Take 1 capsule by mouth daily.   lithium carbonate 300 MG capsule TAKE 1 CAPSULE BY MOUTH AT NIGHT    lithium carbonate 450 MG CR tablet Commonly known as: ESKALITH TAKE 1 TABLET BY MOUTH EVERY DAY IN THE MORNING   magnesium oxide 400 MG tablet Commonly known as: MAG-OX Take 400 mg by mouth daily.   vitamin C 1000 MG tablet Take 1,000 mg by mouth daily.   Vitamin D 1000 units capsule Take 1,000 Units by mouth daily.   zolmitriptan 5 MG nasal solution Commonly known as: ZOMIG Place 1 spray into the nose as needed for migraine.       Allergies:  Allergies  Allergen Reactions  . Divalproex Sodium Other (See Comments)    sluggish  . Oxycodone Nausea Only  . Topamax Other (See Comments)    Memory loss    Past Medical History:  Diagnosis Date  . Bipolar disorder ([facility])   . Cancer ([facility])    rt arm squamous cell ca  . History of migraine headaches   . History of recurrent UTIs     Past Surgical History:  Procedure Laterality Date  . ROTATOR CUFF REPAIR Left   . SKIN CANCER EXCISION    . TOE SURGERY      Family History  Problem Relation Age of  Onset  . Pancreatic cancer Mother   . Heart disease Father   . Suicidality Sister   . Breast cancer Maternal Grandmother   . Colon cancer Neg Hx   . Colon polyps Neg Hx   . Esophageal cancer Neg Hx   . Stomach cancer Neg Hx   . Rectal cancer Neg Hx     Social History:  reports that she has never smoked. She has never used smokeless tobacco. She reports current alcohol use of about 14.0 standard drinks of alcohol per week. She reports that she does not use drugs.  Review of Systems  Cardiovascular: Negative for leg swelling.  Gastrointestinal: Positive for abdominal pain. Negative for nausea.  Musculoskeletal: Positive for joint pain. Negative for back pain.       Bursitis     EXAM:  BP (!) 132/92   Pulse 62   Ht 5\' 3"  (1.6 m)   Wt 136 lb 6 oz (61.9 kg)   SpO2 98%   BMI 24.16 kg/m   GENERAL: Averagely built and nourished  No pallor, clubbing, cervical lymphadenopathy or edema.    Skin:  no rash or  pigmentation.  EYES:  Externally normal.  Fundus exam not indicated  ENT: Oral exam not indicated  THYROID:  Not palpable.  No other mass palpable in the anterior neck   HEART:  Normal  S1 and S2; no murmur or click.  CHEST:  Normal shape Lungs:   Vescicular breath sounds heard equally.  No crepitations/ wheeze.  ABDOMEN:  No distention.  Liver and spleen not palpable.  No other mass or tenderness.  NEUROLOGICAL: .Reflexes are normal to brisk bilaterally at biceps.  She has a coarse tremor of her left hand  SPINE AND JOINTS:  Normal appearance of spine without any prominent spines, kyphosis or tenderness.  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 Discussed potential  effects of hyperparathyroidism long-term on bone health, kidney stones and kidney function Explained to patient that surgery is indicated only there are symptoms of high calcium, calcium level over 1 point above the normal range or known osteoporosis.  Explained that if surgery is indicated this would be done after doing a parathyroid scan and most likely if the patient has single adenoma will need minimally invasive surgery  She will need to have evaluation of her bone density to evaluate any significant effects of the hyperparathyroidism This was ordered today  Discussed that if she has very significant osteoporosis may consider parathyroid surgery Also will evaluate 24 urine calcium has significantly high urine calcium may be risk for renal effects Explained to her how the urine collection will be done and she  was given only collection container for this  Further discussion will be continued when labs are available  History of tremor: This has been evaluated by neurologist and likely from lithium, she was advised to discuss further with PCP regarding referral to psychiatrist for adjustment of her lithium dosage  Elayne Snare 03/18/2021, 9:08 AM

## 2021-03-20 ENCOUNTER — Other Ambulatory Visit: Payer: Self-pay

## 2021-03-20 ENCOUNTER — Ambulatory Visit (INDEPENDENT_AMBULATORY_CARE_PROVIDER_SITE_OTHER)
Admission: RE | Admit: 2021-03-20 | Discharge: 2021-03-20 | Disposition: A | Discharge status: Home / Self Care | Payer: Medicare PPO | Source: Ambulatory Visit | Attending: Endocrinology | Admitting: Endocrinology

## 2021-03-20 ENCOUNTER — Other Ambulatory Visit: Payer: Self-pay | Admitting: Endocrinology

## 2021-03-20 DIAGNOSIS — E213 Hyperparathyroidism, unspecified: Secondary | ICD-10-CM | POA: Diagnosis not present

## 2021-03-20 MED ORDER — RISEDRONATE SODIUM 150 MG PO TABS: 150.0000 mg | ORAL_TABLET | ORAL | 1 refills | Status: DC

## 2021-03-20 NOTE — Progress Notes (Signed)
She does have osteoporosis, for now we will need to start medication to improve bone density/reduce fracture risk.  Sending prescription for risedronate 150 mg to be taken once a month on empty stomach.  She will need to follow-up in 6 months

## 2021-03-23 ENCOUNTER — Other Ambulatory Visit: Payer: Self-pay

## 2021-03-23 ENCOUNTER — Other Ambulatory Visit (INDEPENDENT_AMBULATORY_CARE_PROVIDER_SITE_OTHER): Payer: Medicare PPO

## 2021-03-23 DIAGNOSIS — E213 Hyperparathyroidism, unspecified: Secondary | ICD-10-CM

## 2021-03-24 ENCOUNTER — Ambulatory Visit: Payer: Medicare PPO | Admitting: Orthopaedic Surgery

## 2021-03-24 LAB — CREATININE, URINE, 24 HOUR
Creatinine, 24H Ur: 813 mg/24 hr (ref 800–1800)
Creatinine, Urine: 18.9 mg/dL

## 2021-03-24 LAB — CALCIUM, URINE, 24 HOUR
Calcium, 24H Urine: 254 mg/24 hr (ref 0–320)
Calcium, Urine: 5.9 mg/dL

## 2021-04-06 ENCOUNTER — Telehealth: Payer: Self-pay | Admitting: Orthopaedic Surgery

## 2021-04-06 NOTE — Telephone Encounter (Signed)
Called patient left message to return call to schedule an appointment with Dr Ninfa Linden for hip pain per patient's mychart message

## 2021-05-10 ENCOUNTER — Other Ambulatory Visit: Payer: Self-pay | Admitting: Internal Medicine

## 2021-05-11 ENCOUNTER — Other Ambulatory Visit: Payer: Self-pay | Admitting: Internal Medicine

## 2021-07-22 DIAGNOSIS — R10819 Abdominal tenderness, unspecified site: Secondary | ICD-10-CM | POA: Diagnosis not present

## 2021-07-22 DIAGNOSIS — N760 Acute vaginitis: Secondary | ICD-10-CM | POA: Diagnosis not present

## 2021-07-22 DIAGNOSIS — R300 Dysuria: Secondary | ICD-10-CM | POA: Diagnosis not present

## 2021-07-22 DIAGNOSIS — R197 Diarrhea, unspecified: Secondary | ICD-10-CM | POA: Diagnosis not present

## 2021-07-27 ENCOUNTER — Other Ambulatory Visit: Payer: Self-pay

## 2021-07-27 MED ORDER — LITHIUM CARBONATE ER 450 MG PO TBCR
EXTENDED_RELEASE_TABLET | ORAL | 0 refills | Status: DC
Start: 2021-07-27 — End: 2021-08-11

## 2021-07-27 NOTE — Progress Notes (Signed)
Last OV 02/20/21

## 2021-07-27 NOTE — Progress Notes (Signed)
Refilled for 90 days.

## 2021-08-04 ENCOUNTER — Other Ambulatory Visit: Payer: Self-pay

## 2021-08-04 MED ORDER — LITHIUM CARBONATE 300 MG PO CAPS: ORAL_CAPSULE | ORAL | 0 refills | Status: AC

## 2021-08-04 NOTE — Progress Notes (Signed)
Requested to be sent to [facility]. Last OV 02/20/21

## 2021-08-11 ENCOUNTER — Telehealth: Payer: Self-pay | Admitting: Internal Medicine

## 2021-08-11 ENCOUNTER — Other Ambulatory Visit: Payer: Self-pay

## 2021-08-11 ENCOUNTER — Other Ambulatory Visit: Payer: Self-pay | Admitting: Internal Medicine

## 2021-08-11 ENCOUNTER — Ambulatory Visit: Payer: Medicare PPO | Admitting: Internal Medicine

## 2021-08-11 VITALS — BP 142/84 | HR 71 | Temp 98.3°F | Ht 63.25 in | Wt 134.0 lb

## 2021-08-11 DIAGNOSIS — Z8659 Personal history of other mental and behavioral disorders: Secondary | ICD-10-CM | POA: Diagnosis not present

## 2021-08-11 DIAGNOSIS — K121 Other forms of stomatitis: Secondary | ICD-10-CM

## 2021-08-11 MED ORDER — NYSTATIN-TRIAMCINOLONE 100000-0.1 UNIT/GM-% EX OINT: 1.0000 "application " | TOPICAL_OINTMENT | Freq: Two times a day (BID) | CUTANEOUS | 0 refills | Status: DC

## 2021-08-11 NOTE — Telephone Encounter (Signed)
[name] [phone]  Julia Lee called to say, her lips, tongue and throat are very red with some white spots and she has sore throat, for the last 5 days. I have ask her to take home COVID test and call me back with results.

## 2021-08-11 NOTE — Telephone Encounter (Signed)
scheduled

## 2021-08-11 NOTE — Telephone Encounter (Addendum)
COVID test was negative, scheduled OV at 4:45

## 2021-08-13 ENCOUNTER — Other Ambulatory Visit: Payer: Self-pay | Admitting: Internal Medicine

## 2021-08-18 ENCOUNTER — Encounter: Payer: Self-pay | Admitting: Orthopaedic Surgery

## 2021-08-18 ENCOUNTER — Ambulatory Visit: Payer: Medicare PPO | Admitting: Orthopaedic Surgery

## 2021-08-18 DIAGNOSIS — M7632 Iliotibial band syndrome, left leg: Secondary | ICD-10-CM

## 2021-08-18 NOTE — Progress Notes (Signed)
The patient is a very pleasant and active 70 year old female who wants to establish me as her orthopedic physician.  In March of this year she actually has x-rays obtained of her lumbar spine, her pelvis and both hips and both knees.  She was seen by one of my orthopedic colleagues in town.  She has had pain off and on for the last couple years which is not her today.  She denies any groin pain.  Most of her pain is been IT band pain and she works on stretching activities and being active.  She is very petite and fair skinned female who does have known and well-documented osteoporosis.  The medications that she is on for bone strength right now are causing a lot of side effects.  She meets with her physician this Thursday about potentially changing that medication.  I was able to review her chart in terms of her medications as well as her health in general.  Today she denies any headache, chest pain, shortness of breath, fever, chills, nausea, vomiting  On exam she has full and fluid range of motion of both hips and both knees.  She has a little bit of IT band pain on the left side but otherwise her joints move smoothly and fluidly.  I did independently review the x-rays of both her hips and knees and she has very well-maintained joint space of both hips and both knees.  I gave her reassurance that there is not a surgical indication for her for her hips or knees and that maintaining her active life is important and stretching will help.  We at least have a baseline of her from the standpoint of orthopedics.  All question concerns were answered addressed.  Follow-up with me as needed.

## 2021-08-19 DIAGNOSIS — F4322 Adjustment disorder with anxiety: Secondary | ICD-10-CM | POA: Diagnosis not present

## 2021-08-20 ENCOUNTER — Ambulatory Visit: Payer: Medicare PPO | Admitting: Endocrinology

## 2021-08-20 ENCOUNTER — Encounter: Payer: Self-pay | Admitting: Endocrinology

## 2021-08-20 ENCOUNTER — Other Ambulatory Visit: Payer: Self-pay

## 2021-08-20 VITALS — BP 126/74 | HR 62 | Ht 63.0 in | Wt 135.2 lb

## 2021-08-20 DIAGNOSIS — E213 Hyperparathyroidism, unspecified: Secondary | ICD-10-CM | POA: Diagnosis not present

## 2021-08-20 DIAGNOSIS — M818 Other osteoporosis without current pathological fracture: Secondary | ICD-10-CM

## 2021-08-20 LAB — VITAMIN D 25 HYDROXY (VIT D DEFICIENCY, FRACTURES): VITD: 105.3 ng/mL (ref 30.00–100.00)

## 2021-08-20 LAB — BASIC METABOLIC PANEL
BUN: 24 mg/dL — ABNORMAL HIGH (ref 6–23)
CO2: 27 mEq/L (ref 19–32)
Calcium: 10.6 mg/dL — ABNORMAL HIGH (ref 8.4–10.5)
Chloride: 104 mEq/L (ref 96–112)
Creatinine, Ser: 0.92 mg/dL (ref 0.40–1.20)
GFR: 63.42 mL/min (ref >60.00)
Glucose, Bld: 100 mg/dL — ABNORMAL HIGH (ref 70–99)
Potassium: 5 mEq/L (ref 3.5–5.1)
Sodium: 136 mEq/L (ref 135–145)

## 2021-08-20 NOTE — Progress Notes (Signed)
Patient ID: Julia Lee, female   DOB: [birth year], 70 y.o.   MRN: [id number]           Chief complaint: Follow-up of calcium and osteoporosis  History of Present Illness:  Referring physician: Mary Baxley  HYPERCALCEMIA:  Review of records show that she has had a high calcium since 2012.    Lab Results  Component Value Date   CALCIUM 11.1 (H) 02/20/2021   CALCIUM 10.8 (H) 02/17/2021   CALCIUM 10.8 (H) 11/09/2019   CALCIUM 10.4 02/07/2018   CALCIUM 10.5 (H) 01/31/2017   CALCIUM 10.6 (H) 02/24/2016   CALCIUM 10.6 (H) 02/26/2015   CALCIUM 10.6 (H) 03/30/2011    The hypercalcemia is not associated with any history of fractures, renal insufficiency, history of kidney stones  Has been diagnosed to have primary hyperparathyroidism  OSTEOPOROSIS: She thinks she may have lost only less than an inch in height, previously was 5 feet 3.75 inches tall Bone density results as below  Age at menopause was 48-50; no previous history of HRT  Because of the lowest T score of -3.1 she has been taking Actonel 150 mg weekly She thinks that with taking this she will have diarrhea and some reflux for a few days She did not take it this month Also she says she is otherwise having other issues such as with her bladder and dry mouth and she thinks these are related to the medication  Lab Results  Component Value Date   PTH 70 02/20/2021   CALCIUM 11.1 (H) 02/20/2021   CAION 1.36 (H) 06/18/2012      25 (OH) Vitamin D supplements: On 800 U daily  Lab Results  Component Value Date   VD25OH 70 01/31/2017   VD25OH 61 02/24/2016    Results of bone density study on 03/20/2021:    Lumbar spine L1-L4 Femoral neck (FN) 33% distal radius Ultradistal radius  T-score -1.4 RFN: -2.9 LFN: -3.1 -3.1 -3.1     Allergies as of 08/20/2021       Reactions   Divalproex Sodium Other (See Comments)   sluggish   Oxycodone Nausea Only   Topamax Other (See Comments)   Memory loss         Medication List        Accurate as of August 20, 2021 10:37 AM. If you have any questions, ask your nurse or doctor.          aspirin EC 325 MG tablet Take 650 mg by mouth as needed.   b complex vitamins tablet Take 1 tablet by mouth daily.   Fish Oil 1000 MG Caps Take 1 capsule by mouth daily.   lithium carbonate 300 MG capsule TAKE 1 CAPSULE BY MOUTH AT NIGHT   lithium carbonate 450 MG CR tablet Commonly known as: ESKALITH TAKE 1 TABLET BY MOUTH EVERY DAY IN THE MORNING   magnesium oxide 400 MG tablet Commonly known as: MAG-OX Take 400 mg by mouth daily.   nystatin-triamcinolone ointment Commonly known as: MYCOLOG Apply 1 application topically 2 (two) times daily.   risedronate 150 MG tablet Commonly known as: ACTONEL Take 1 tablet (150 mg total) by mouth every 30 (thirty) days. with water on empty stomach, nothing by mouth or lie down for next 30 minutes.   vitamin C 1000 MG tablet Take 1,000 mg by mouth daily.   Vitamin D 1000 units capsule Take 1,000 Units by mouth daily.   zolmitriptan 5 MG nasal solution Commonly known as: [place]  1 spray into the nose as needed for migraine.        Allergies:  Allergies  Allergen Reactions   Divalproex Sodium Other (See Comments)    sluggish   Oxycodone Nausea Only   Topamax Other (See Comments)    Memory loss    Past Medical History:  Diagnosis Date   Bipolar disorder ([place])    Cancer ([place])    rt arm squamous cell ca   History of migraine headaches    History of recurrent UTIs    Migraines     Past Surgical History:  Procedure Laterality Date   ROTATOR CUFF REPAIR Left    SKIN CANCER EXCISION     TOE SURGERY      Family History  Problem Relation Age of Onset   Pancreatic cancer Mother    Heart disease Father    Suicidality Sister    Breast cancer Maternal Grandmother    Diabetes Maternal Grandmother    Colon cancer Neg Hx    Colon polyps Neg Hx    Esophageal cancer Neg Hx     Stomach cancer Neg Hx    Rectal cancer Neg Hx    Thyroid disease Neg Hx     Social History:  reports that she has never smoked. She has never used smokeless tobacco. She reports current alcohol use of about 14.0 standard drinks per week. She reports that she does not use drugs.  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:  She has had persistent hypercalcemia for over 10 years at least  Will check calcium levels again today along with renal function   OSTEOPOROSIS: She is asymptomatic She thinks she is having diarrhea from Actonel Otherwise do not feel that her other symptoms that she has had on and off including dry mouth are related to the medication Discussed that medication is primarily active in the bone and is not systemic  Since she has GI side effects she will do better with Reclast and we will schedule this, reassured her that this is safe and effective with annual infusions Discussed how this was, minimal side effects and she will need to be hydrated on the days off infusion along with taking some Tylenol to prevent mild fever or body aches   Follow-up in 6 months  Terrah Decoster 08/20/2021, 10:37 AM

## 2021-08-26 ENCOUNTER — Telehealth: Payer: Self-pay | Admitting: Internal Medicine

## 2021-08-26 NOTE — Telephone Encounter (Signed)
Mailed Last CPE Office Notes and labs to  Dennison Nancy, MSW, [address] [place], [place] [address]

## 2021-08-27 DIAGNOSIS — L853 Xerosis cutis: Secondary | ICD-10-CM | POA: Diagnosis not present

## 2021-08-27 DIAGNOSIS — L570 Actinic keratosis: Secondary | ICD-10-CM | POA: Diagnosis not present

## 2021-08-27 DIAGNOSIS — Z85828 Personal history of other malignant neoplasm of skin: Secondary | ICD-10-CM | POA: Diagnosis not present

## 2021-08-27 DIAGNOSIS — D045 Carcinoma in situ of skin of trunk: Secondary | ICD-10-CM | POA: Diagnosis not present

## 2021-08-27 DIAGNOSIS — D485 Neoplasm of uncertain behavior of skin: Secondary | ICD-10-CM | POA: Diagnosis not present

## 2021-08-28 ENCOUNTER — Ambulatory Visit: Payer: Medicare PPO | Admitting: Endocrinology

## 2021-08-29 ENCOUNTER — Other Ambulatory Visit: Payer: Self-pay | Admitting: Internal Medicine

## 2021-08-31 DIAGNOSIS — F3170 Bipolar disorder, currently in remission, most recent episode unspecified: Secondary | ICD-10-CM | POA: Diagnosis not present

## 2021-09-01 ENCOUNTER — Telehealth: Payer: Self-pay | Admitting: Internal Medicine

## 2021-09-01 NOTE — Telephone Encounter (Signed)
Called patient back and gave her the names and she will discuss with Dr Dwyane Dee.

## 2021-09-01 NOTE — Telephone Encounter (Signed)
Julia Lee [phone]  [name] called to say when she was in, you guys talked some about osteoporosis, and she had said she had tried the medication residranate with Dr Dwyane Dee and had side effects and he has suggested Reclast infusion for once a year, but she wanted to know the names of medications that you were talking about so she could look them up and think about what she wants to do.

## 2021-09-07 ENCOUNTER — Telehealth: Payer: Self-pay | Admitting: Nutrition

## 2021-09-07 NOTE — Telephone Encounter (Signed)
Called to schedule reclast infusion.  Patient said that she wanted to discuss this option further with Dr. Dwyane Dee, and will call the office to make another appointment with him.

## 2021-09-08 ENCOUNTER — Encounter: Payer: Self-pay | Admitting: Internal Medicine

## 2021-09-08 NOTE — Progress Notes (Signed)
   Subjective:    Patient ID: Julia Lee, female    DOB: Sep 07, 1951, 70 y.o.   MRN: [id number]  HPI 70 year old Female called with history of lips, tongue and throat being red with some white spots.  Complaint of sore throat.  Dr. Dwyane Dee is to see her November 10 for follow-up on hypercalcemia.  Has been treated with Actonel for history of osteoporosis.  Had bone density study in June 2022.  Parathyroid hormone assay in May 2022 was normal at 12 but had increased from 6 a year ago.  He feels that she has hypercalcemia due to hyperparathyroidism.  She thinks Actonel was causing diarrhea.  Reclast would be an option.  Had flu vaccine in [place] where she has a vacation home.    Review of Systems see above     Objective:   Physical Exam Blood pressure 142/84 pulse 71 temperature 98.3 degrees pulse oximetry 98% weight 134 pounds BMI 23.55  Oral mucosa mildly inflamed but no ulcerations.       Assessment & Plan:  Osteoporosis treated by Dr. Dwyane Dee with Actonel  History of elevated serum potassium but normal parathyroid hormone assay  Stomatitis: My research indicates Actonel would not cause stomatitis  Plan: Treat with Dukes Magic mouthwash 2 teaspoons to swish do not swallow 4 times a day.

## 2021-09-08 NOTE — Telephone Encounter (Signed)
She mentioned to me that she wanted to see if prolia can be an option, or a bone building agent, not a bisphosphonate. She mentioned that she had a few more questions for  you.

## 2021-09-08 NOTE — Patient Instructions (Addendum)
Encouraged her to bring up her concerns with Dr. Dwyane Dee on November 10 regarding taking Actonel.  I do feel she needs some treatment for osteoporosis.  Follow-up with Dr. Dwyane Dee regarding elevated serum calcium.  Use Dukes Magic mouthwash 30 cc swish do not swallow 4 times a day until stomatitis improves.

## 2021-09-22 DIAGNOSIS — F4322 Adjustment disorder with anxiety: Secondary | ICD-10-CM | POA: Diagnosis not present

## 2021-09-28 ENCOUNTER — Ambulatory Visit: Payer: Medicare PPO | Admitting: Internal Medicine

## 2021-09-28 ENCOUNTER — Encounter: Payer: Self-pay | Admitting: Internal Medicine

## 2021-09-28 ENCOUNTER — Other Ambulatory Visit: Payer: Self-pay

## 2021-09-28 VITALS — BP 142/98 | HR 81 | Temp 98.0°F | Ht 63.0 in | Wt 142.0 lb

## 2021-09-28 DIAGNOSIS — M810 Age-related osteoporosis without current pathological fracture: Secondary | ICD-10-CM

## 2021-09-28 DIAGNOSIS — F411 Generalized anxiety disorder: Secondary | ICD-10-CM | POA: Diagnosis not present

## 2021-09-28 DIAGNOSIS — K219 Gastro-esophageal reflux disease without esophagitis: Secondary | ICD-10-CM | POA: Diagnosis not present

## 2021-09-28 DIAGNOSIS — Z8659 Personal history of other mental and behavioral disorders: Secondary | ICD-10-CM

## 2021-09-28 DIAGNOSIS — E210 Primary hyperparathyroidism: Secondary | ICD-10-CM | POA: Diagnosis not present

## 2021-09-28 DIAGNOSIS — R1013 Epigastric pain: Secondary | ICD-10-CM | POA: Diagnosis not present

## 2021-09-28 MED ORDER — LIDOCAINE VISCOUS HCL 2 % MT SOLN
15.0000 mL | Freq: Once | OROMUCOSAL | Status: AC
  Administered 2021-09-28: 15:00:00 15 mL via OROMUCOSAL

## 2021-09-28 MED ORDER — PANTOPRAZOLE SODIUM 40 MG PO TBEC: 40.0000 mg | DELAYED_RELEASE_TABLET | Freq: Every day | ORAL | 3 refills | Status: DC

## 2021-09-28 MED ORDER — ALUM & MAG HYDROXIDE-SIMETH 200-200-20 MG/5ML PO SUSP
30.0000 mL | Freq: Once | ORAL | Status: AC
  Administered 2021-09-28: 15:00:00 30 mL via ORAL

## 2021-09-28 MED ORDER — LORAZEPAM 1 MG PO TABS: ORAL_TABLET | ORAL | 0 refills | Status: DC

## 2021-09-28 NOTE — Progress Notes (Signed)
° °  Subjective:    Patient ID: Julia Lee, female    DOB: 11-29-1950, 70 y.o.   MRN: [id number]  HPI 70 year old Female seen with anxiety and GE reflux symptoms. Saw Dennison Nancy for counseling recently at my suggestion as she used to work with Dr. Ovidio Hanger who initially diagnosed patient with Bipolar disorder. Patient not sure she got much out of seesion with Ms. Rosser. Remains on Litjium.  She has hyperparathyroidism followed by Dr. Dwyane Dee.  She has osteoporosis also followed by Dr. Dwyane Dee.  Feeling more anxious recently. Given Rx for Ativan to take up to twice daily if needed for anxiety.  Patient is a retired Pharmacist, hospital. Husband teaches Therapist, occupational at [employer]. May need psychiatric consult since she has more anxiety.  Also, having dyspepsia and GERD. Wants to see Gastroenterologist. We are going to start PPI and see how she does over the next couple of weeks. Diet discussed.    Review of Systems see above- no vomiting or hematemesis     Objective:   Physical Exam   VS reviewed. No abdominal masses or significant tenderness. 30cc po GI cocktail given  Seems anxious      Assessment & Plan:  GERD- GI cocktail given. Started on Protonix. Patient wants tio see GI. See if better in 2 weeks first.  Anxiety- daughter coming to visit. Xanax prescribed  Osteoporosis- seen by Dr. Dwyane Dee  Hyperparathyroidism- followed by Dr. Dwyane Dee  Bipolar disorder treated for years on Lithium. May need med consult with psych.

## 2021-10-06 ENCOUNTER — Encounter: Payer: Self-pay | Admitting: Internal Medicine

## 2021-10-06 NOTE — Patient Instructions (Addendum)
Start Protonix 40 mg daily and call in not better in 2 weeks. May need GI consult. Continue follow up with Dr. Dwyane Dee for hyperparathyroidism and osteoporosis. Consider Psychiatric consult for medication adjustment. Given GI cocktail in office today.

## 2021-10-07 ENCOUNTER — Telehealth: Payer: Self-pay

## 2021-10-07 NOTE — Telephone Encounter (Signed)
I called the patient and asked if she would be willing to see Adolph Pollack for medication management for anxiety. She states that she isnt taking the ativan. She didn't like it. She was advised that we didn't normally send in medication for anxiety and she states that her anxiety comes from the stomach. She wants to talk to GI and deal with them before going to another provider. She only gets anxious when she has the stomach upset/heartburn.

## 2021-10-09 ENCOUNTER — Encounter: Payer: Self-pay | Admitting: Nurse Practitioner

## 2021-10-28 ENCOUNTER — Ambulatory Visit: Payer: Medicare PPO | Admitting: Nurse Practitioner

## 2021-10-29 ENCOUNTER — Telehealth: Payer: Self-pay

## 2021-10-29 NOTE — Telephone Encounter (Signed)
Results have been relayed to the patient. The patient verbalized understanding. No questions at this time.  Records faxed.

## 2021-10-29 NOTE — Telephone Encounter (Signed)
Patient states that she is feeling better but her anxiety is really bad. She will see someone for it if you think it will help. Pleas advise.

## 2021-10-29 NOTE — Telephone Encounter (Signed)
Patient has an appointment with Loletta Specter, NP on 11/09/2021

## 2021-10-30 ENCOUNTER — Encounter: Payer: Self-pay | Admitting: Nurse Practitioner

## 2021-11-09 DIAGNOSIS — F411 Generalized anxiety disorder: Secondary | ICD-10-CM | POA: Diagnosis not present

## 2021-11-09 DIAGNOSIS — F3174 Bipolar disorder, in full remission, most recent episode manic: Secondary | ICD-10-CM | POA: Diagnosis not present

## 2021-11-10 DIAGNOSIS — Z79891 Long term (current) use of opiate analgesic: Secondary | ICD-10-CM | POA: Diagnosis not present

## 2021-11-12 NOTE — Progress Notes (Signed)
11/13/2021 Julia Lee [id number] 1951/09/18   CHIEF COMPLAINT: Abdominal pain, weight loss   HISTORY OF PRESENT ILLNESS: Julia Lee is a 71 year old female with a past medical history of anxiety, depression, bipolar disorder, migraine headaches, chronic left hand tremor and osteoporosis. She presents to our office today as referred by Dr. Tedra Senegal for further evaluation regarding dyspepsia and GERD.  She complains of having intermittent epigastric pain and less frequently had lower abdominal pain with a decreased appetite for the past 2 months. Eating fatty or spicy foods sometimes worsens her epigastric pain, other times does not. She has difficulty swallowing foods such as bread which started 2 weeks ago and occurs almost daily. She drinks water and the stuck food passes down the esophagus. No heartburn. No N/V. She was prescribed Pantoprazole 40mg  po QD by Dr. Renold Genta 09/28/2021 which she took for about 2 weeks without improvement so she stopped taking it.  She tried Prilosec 20 mg daily for 2 weeks without improvement.  She is taking Pepcid OTC 3-4 times weekly as needed and Tums with brief relief.  She is tolerating a bland diet, eating oatmeal, rice and bananas. She reported losing 10 - 15 lbs over the past 2 months.  No fever, sweats or chills.  She previously took aspirin 325 mg 1 tab 2 or 3 days weekly for headaches for the past 40 years, she stopped taking aspirin 2 weeks ago.  She also took Ibuprofen 200 mg 2 tabs once or twice weekly for ankle and hip pain for the past 10 years.  She typically drinks 2 to 3 cups of coffee but cut back to 1-1/2 cups 2 weeks ago.  She drinks 2 to 3 glasses of wine most evenings for few years then decrease her wine intake to 1 small glass every evening for the past year.  She is passing brown soft, not loose stools once or twice daily.  She took Pepto-Bismol a few days ago which resulted in passing 1 black stool.  No further black stools since  then.  No rectal bleeding.  She underwent a colonoscopy 03/01/2018 which showed a tortuous colon and diverticulosis to the sigmoid colon, no polyps.  CBC Latest Ref Rng & Units 02/17/2021 11/09/2019 02/07/2018  WBC 3.8 - 10.8 Thousand/uL 6.3 4.3 4.3  Hemoglobin 11.7 - 15.5 g/dL 13.4 13.8 13.3  Hematocrit 35.0 - 45.0 % 41.3 40.7 39.5  Platelets 140 - 400 Thousand/uL 296 265 273    CMP Latest Ref Rng & Units 08/20/2021 02/20/2021 02/17/2021  Glucose 70 - 99 mg/dL 100(H) - 98  BUN 6 - 23 mg/dL 24(H) - 23  Creatinine 0.40 - 1.20 mg/dL 0.92 - 0.90  Sodium 135 - 145 mEq/L 136 - 136  Potassium 3.5 - 5.1 mEq/L 5.0 5.0 5.6(H)  Chloride 96 - 112 mEq/L 104 - 105  CO2 19 - 32 mEq/L 27 - 26  Calcium 8.4 - 10.5 mg/dL 10.6(H) 11.1(H) 10.8(H)  Total Protein 6.1 - 8.1 g/dL - - 6.7  Total Bilirubin 0.2 - 1.2 mg/dL - - 0.8  Alkaline Phos 33 - 130 U/L - - -  AST 10 - 35 U/L - - 11  ALT 6 - 29 U/L - - 11    Colonoscopy 03/01/2018 by Dr. Havery Moros. - Diverticulosis in the sigmoid colon. - Severely tortous colon. - The examination was otherwise normal on direct and retroflexion views. - No polyps - Recall colonoscopy in 10 years  Past Medical History:  Diagnosis  Date   Anxiety    Bipolar disorder ([place])    Cancer ([place])    rt arm squamous cell ca   History of migraine headaches    History of recurrent UTIs    Migraines    Past Surgical History:  Procedure Laterality Date   ROTATOR CUFF REPAIR Left    SKIN CANCER EXCISION     TOE SURGERY     Social History: She is married.  She has 1 daughter. Retired Patent examiner. Nonsmoker. She drinks one small glass of wine most evenings, previously drank 2 to 3 glasses of wine every evening for 2 to 3 years. No drug use.   Family History: Mother died age 85 pancreatic cancer.  Father died age 22 MI. Maternal grandmother with history of breast cancer.  Suicidality in her sister.  Allergies  Allergen Reactions   Divalproex Sodium Other (See Comments)     sluggish   Oxycodone Nausea Only   Topamax Other (See Comments)    Memory loss     Outpatient Encounter Medications as of 11/13/2021  Medication Sig   Ascorbic Acid (VITAMIN C) 1000 MG tablet Take 1,000 mg by mouth daily.   aspirin EC 325 MG tablet Take 650 mg by mouth as needed.   b complex vitamins tablet Take 1 tablet by mouth daily.   Cholecalciferol (VITAMIN D) 1000 UNITS capsule Take 1,000 Units by mouth daily.   hydrOXYzine (VISTARIL) 25 MG capsule Take by mouth.   lithium carbonate (ESKALITH) 450 MG CR tablet TAKE 1 TABLET BY MOUTH EVERY DAY IN THE MORNING   lithium carbonate 300 MG capsule TAKE 1 CAPSULE BY MOUTH AT NIGHT   LORazepam (ATIVAN) 1 MG tablet One half to one tab twice daily as needed for anxiety   magnesium oxide (MAG-OX) 400 MG tablet Take 400 mg by mouth daily.   nystatin-triamcinolone ointment (MYCOLOG) Apply 1 application topically 2 (two) times daily.   Omega-3 Fatty Acids (FISH OIL) 1000 MG CAPS Take 1 capsule by mouth daily.   pantoprazole (PROTONIX) 40 MG tablet Take 1 tablet (40 mg total) by mouth daily. (Patient taking differently: Take 40 mg by mouth daily. As needed)   zolmitriptan (ZOMIG) 5 MG nasal solution Place 1 spray into the nose as needed for migraine.   [DISCONTINUED] 0.9 %  sodium chloride infusion    No facility-administered encounter medications on file as of 11/13/2021.    REVIEW OF SYSTEMS:  Gen: See HPI. CV: Denies chest pain, palpitations or edema. Resp: Denies cough, shortness of breath of hemoptysis.  GI: See HPI. GU : Denies urinary burning, blood in urine, increased urinary frequency or incontinence. MS: Denies joint pain, muscles aches or weakness. Derm: Denies rash, itchiness, skin lesions or unhealing ulcers. Psych: See HPI. On lithium.  Recent increased anxiety. Heme: Denies bruising, bleeding. Neuro:  Denies headaches, dizziness or paresthesias. Endo:  Denies any problems with DM, thyroid or adrenal function.  PHYSICAL  EXAM: BP (!) 144/90    Pulse 79    Ht 5' 3.5" (1.613 m)    Wt 123 lb (55.8 kg)    SpO2 97%    BMI 21.45 kg/m  General: 71 year old female tremulous in NAD. Head: Normocephalic and atraumatic. Eyes:  Sclerae non-icteric, conjunctive pink. Ears: Normal auditory acuity. Mouth: Dentition intact. No ulcers or lesions.  Neck: Supple, no lymphadenopathy. Thyroid with mild fullness without discrete nodules. Lungs: Clear bilaterally to auscultation without wheezes, crackles or rhonchi. Heart: Regular rate and rhythm. No  murmur, rub or gallop appreciated.  Abdomen: Soft, nontender, non distended. No masses. No hepatosplenomegaly. Normoactive bowel sounds x 4 quadrants.  Rectal: Deferred.  Musculoskeletal: Symmetrical with no gross deformities. Skin: Warm and dry. No rash or lesions on visible extremities. Extremities: No edema. Neurological: Alert oriented x 4, no focal deficits.  Psychological:  Alert and cooperative. Normal mood and affect.  ASSESSMENT AND PLAN:  60) 71 year old female with dysphagia x 2 weeks, epigastric pain x 2 months -EGD asap. EGD benefits and risks discussed including risk with sedation, risk of bleeding, perforation and infection  -Pantoprazole 40 mg p.o. twice daily, patient has supply -Recommended eating 4 small snacks sized meals daily -Ensure or boost 8 ounces daily -CBC, CMP and lipase level  2) Weight loss secondary to decreased appetite and epigastric pain a contributing factor. -TSH -CTAP to rule out any intra abdominal/pelvic pathology to explain weight loss. Mother with history of pancreatic cancer.   3) Colon cancer screening, up to date. Colonoscopy 02/2018 showed diverticulosis and a tortuous colon, no polyps. -Next screening colonoscopy due 02/2028  4) Intermittent lower abdominal pain x 2 months, no abdominal pain at this time.  -CTAP as ordered above   5) Anxiety -Follow-up with PCP       CC:  Baxley, Cresenciano Lick, MD

## 2021-11-13 ENCOUNTER — Telehealth: Payer: Self-pay

## 2021-11-13 ENCOUNTER — Ambulatory Visit: Payer: Medicare PPO | Admitting: Nurse Practitioner

## 2021-11-13 ENCOUNTER — Encounter: Payer: Self-pay | Admitting: Nurse Practitioner

## 2021-11-13 ENCOUNTER — Other Ambulatory Visit (INDEPENDENT_AMBULATORY_CARE_PROVIDER_SITE_OTHER): Payer: Medicare PPO

## 2021-11-13 VITALS — BP 144/90 | HR 79 | Ht 63.5 in | Wt 123.0 lb

## 2021-11-13 DIAGNOSIS — R1310 Dysphagia, unspecified: Secondary | ICD-10-CM | POA: Insufficient documentation

## 2021-11-13 DIAGNOSIS — R634 Abnormal weight loss: Secondary | ICD-10-CM | POA: Diagnosis not present

## 2021-11-13 DIAGNOSIS — R1013 Epigastric pain: Secondary | ICD-10-CM

## 2021-11-13 LAB — COMPREHENSIVE METABOLIC PANEL
ALT: 13 U/L (ref 0–35)
AST: 14 U/L (ref 0–37)
Albumin: 4.7 g/dL (ref 3.5–5.2)
Alkaline Phosphatase: 26 U/L — ABNORMAL LOW (ref 39–117)
BUN: 17 mg/dL (ref 6–23)
CO2: 27 mEq/L (ref 19–32)
Calcium: 11.5 mg/dL — ABNORMAL HIGH (ref 8.4–10.5)
Chloride: 103 mEq/L (ref 96–112)
Creatinine, Ser: 0.92 mg/dL (ref 0.40–1.20)
GFR: 63.32 mL/min (ref >60.00)
Glucose, Bld: 107 mg/dL — ABNORMAL HIGH (ref 70–99)
Potassium: 4.4 mEq/L (ref 3.5–5.1)
Sodium: 135 mEq/L (ref 135–145)
Total Bilirubin: 0.7 mg/dL (ref 0.2–1.2)
Total Protein: 7 g/dL (ref 6.0–8.3)

## 2021-11-13 LAB — CBC WITH DIFFERENTIAL/PLATELET
Basophils Absolute: 0.1 10*3/uL (ref 0.0–0.1)
Basophils Relative: 1.1 % (ref 0.0–3.0)
Eosinophils Absolute: 0.2 10*3/uL (ref 0.0–0.7)
Eosinophils Relative: 2 % (ref 0.0–5.0)
HCT: 43.3 % (ref 36.0–46.0)
Hemoglobin: 14.5 g/dL (ref 12.0–15.0)
Lymphocytes Relative: 19.1 % (ref 12.0–46.0)
Lymphs Abs: 1.4 10*3/uL (ref 0.7–4.0)
MCHC: 33.4 g/dL (ref 30.0–36.0)
MCV: 97.7 fl (ref 78.0–100.0)
Monocytes Absolute: 0.7 10*3/uL (ref 0.1–1.0)
Monocytes Relative: 9.1 % (ref 3.0–12.0)
Neutro Abs: 5.1 10*3/uL (ref 1.4–7.7)
Neutrophils Relative %: 68.7 % (ref 43.0–77.0)
Platelets: 301 10*3/uL (ref 150.0–400.0)
RBC: 4.43 Mil/uL (ref 3.87–5.11)
RDW: 13.1 % (ref 11.5–15.5)
WBC: 7.5 10*3/uL (ref 4.0–10.5)

## 2021-11-13 LAB — LIPASE: Lipase: 12 U/L (ref 11.0–59.0)

## 2021-11-13 LAB — TSH: TSH: 1.54 u[IU]/mL (ref 0.35–5.50)

## 2021-11-13 NOTE — Progress Notes (Signed)
LMOM

## 2021-11-13 NOTE — Telephone Encounter (Signed)
She has seen Dr Reece Levy.

## 2021-11-13 NOTE — Telephone Encounter (Signed)
-----   Message from Elby Showers, MD sent at 11/13/2021 11:13 AM EST ----- Please call and see that she has followed up with Psychiatrist we suggested previously(Dr. Reece Levy) ----- Message ----- From: Noralyn Pick, NP Sent: 11/13/2021  11:06 AM EST To: Elby Showers, MD

## 2021-11-13 NOTE — Telephone Encounter (Signed)
Left message for patient to return call to office at [phone].  

## 2021-11-13 NOTE — Progress Notes (Signed)
Agree with assessment and plan as outlined.  

## 2021-11-13 NOTE — Patient Instructions (Signed)
PROCEDURES: You have been scheduled for a EGD. Please follow the written instructions given to you at your visit today. If you use inhalers (even only as needed), please bring them with you on the day of your procedure.  LABS:   Please proceed to the basement level for lab work before leaving today. Press "B" on the elevator. The lab is located at the first door on the left as you exit the elevator.  HEALTHCARE LAWS AND MY CHART RESULTS:   Due to recent changes in healthcare laws, you may see results of your imaging and/or laboratory studies on MyChart before I have had a chance to review them.  I understand that in some cases there may be results that are confusing or concerning to you. Please understand that not all results are received at the same time and often I may need to interpret multiple results in order to provide you with the best plan of care or course of treatment. Therefore, I ask that you please give me 48 hours to thoroughly review all your results before contacting my office for clarification.   IMAGING: You will be contacted by [facility] (Your caller ID will indicate phone # [phone]) in the next 7 days to schedule your CT Scan. If you have not heard from them within 7 business days, please call [facility] at [phone] to follow up on the status of your appointment.    RECOMMENDATIONS:  Take Pantoprazole 40 MG one twice a day. Eat 4 small snack sized meals daily. Ensure or Boost 8 oz daily.  BMI:  If you are age 43 or older, your body mass index should be between 23-30. Your Body mass index is 21.45 kg/m. If this is out of the aforementioned range listed, please consider follow up with your Primary Care Provider.  If you are age 41 or younger, your body mass index should be between 19-25. Your Body mass index is 21.45 kg/m. If this is out of the aformentioned range listed, please consider follow up with your Primary Care  Provider.   MY CHART:  The [facility] providers would like to encourage you to use [facility] to communicate with providers for non-urgent requests or questions.  Due to long hold times on the telephone, sending your provider a message by [facility] may be a faster and more efficient way to get a response.  Please allow 48 business hours for a response.  Please remember that this is for non-urgent requests.   Thank you for trusting me with your gastrointestinal care!    Noralyn Pick, CRNP

## 2021-11-17 ENCOUNTER — Telehealth: Payer: Self-pay | Admitting: Endocrinology

## 2021-11-17 ENCOUNTER — Ambulatory Visit (AMBULATORY_SURGERY_CENTER): Payer: Medicare PPO | Admitting: Gastroenterology

## 2021-11-17 ENCOUNTER — Encounter: Payer: Self-pay | Admitting: Gastroenterology

## 2021-11-17 VITALS — BP 124/74 | HR 65 | Temp 98.2°F | Resp 12 | Ht 63.5 in | Wt 123.0 lb

## 2021-11-17 DIAGNOSIS — R1310 Dysphagia, unspecified: Secondary | ICD-10-CM | POA: Diagnosis not present

## 2021-11-17 DIAGNOSIS — K449 Diaphragmatic hernia without obstruction or gangrene: Secondary | ICD-10-CM

## 2021-11-17 DIAGNOSIS — R634 Abnormal weight loss: Secondary | ICD-10-CM

## 2021-11-17 DIAGNOSIS — R1013 Epigastric pain: Secondary | ICD-10-CM

## 2021-11-17 MED ORDER — SODIUM CHLORIDE 0.9 % IV SOLN: 500.0000 mL | Freq: Once | INTRAVENOUS | Status: DC

## 2021-11-17 NOTE — Progress Notes (Signed)
Vss nad trans to pacu °

## 2021-11-17 NOTE — Telephone Encounter (Signed)
Since her calcium level is higher than usual please schedule her for office visit to discuss further options

## 2021-11-17 NOTE — Progress Notes (Signed)
History and Physical Interval Note: Seen 11/13/21 for dysphagia, epigastric pain, weight loss. PPI hasn't helped but only short lived course so far, now on BID PPI. CT scan pending. Here for EGD to further evaluate. No interval changes since her visit a few days ago. I have discussed risks / benefits and she wishes to proceed.     11/17/2021 2:04 PM  Julia Lee  has presented today for endoscopic procedure(s), with the diagnosis of  Encounter Diagnoses  Name Primary?   Abdominal pain, epigastric Yes   Dysphagia, unspecified type    Weight loss   .  The various methods of evaluation and treatment have been discussed with the patient and/or family. After consideration of risks, benefits and other options for treatment, the patient has consented to  the endoscopic procedure(s).   The patient's history has been reviewed, patient examined, no change in status, stable for surgery.  I have reviewed the patient's chart and labs.  Questions were answered to the patient's satisfaction.    Jolly Mango, MD [facility]

## 2021-11-17 NOTE — Patient Instructions (Signed)
Resume previous diet and medications. Awaiting pathology results. Awaiting CT scan scheduled for later this week to further evaluate epigastric pain and weight loss.  YOU HAD AN ENDOSCOPIC PROCEDURE TODAY AT [facility]:   Refer to the procedure report that was given to you for any specific questions about what was found during the examination.  If the procedure report does not answer your questions, please call your gastroenterologist to clarify.  If you requested that your care partner not be given the details of your procedure findings, then the procedure report has been included in a sealed envelope for you to review at your convenience later.  YOU SHOULD EXPECT: Some feelings of bloating in the abdomen. Passage of more gas than usual.  Walking can help get rid of the air that was put into your GI tract during the procedure and reduce the bloating. If you had a lower endoscopy (such as a colonoscopy or flexible sigmoidoscopy) you may notice spotting of blood in your stool or on the toilet paper. If you underwent a bowel prep for your procedure, you may not have a normal bowel movement for a few days.  Please Note:  You might notice some irritation and congestion in your nose or some drainage.  This is from the oxygen used during your procedure.  There is no need for concern and it should clear up in a day or so.  SYMPTOMS TO REPORT IMMEDIATELY:  Following upper endoscopy (EGD)  Vomiting of blood or coffee ground material  New chest pain or pain under the shoulder blades  Painful or persistently difficult swallowing  New shortness of breath  Fever of 100F or higher  Black, tarry-looking stools  For urgent or emergent issues, a gastroenterologist can be reached at any hour by calling [phone]. Do not use MyChart messaging for urgent concerns.    DIET:  We do recommend a small meal at first, but then you may proceed to your regular diet.  Drink plenty of fluids but  you should avoid alcoholic beverages for 24 hours.  ACTIVITY:  You should plan to take it easy for the rest of today and you should NOT DRIVE or use heavy machinery until tomorrow (because of the sedation medicines used during the test).    FOLLOW UP: Our staff will call the number listed on your records 48-72 hours following your procedure to check on you and address any questions or concerns that you may have regarding the information given to you following your procedure. If we do not reach you, we will leave a message.  We will attempt to reach you two times.  During this call, we will ask if you have developed any symptoms of COVID 19. If you develop any symptoms (ie: fever, flu-like symptoms, shortness of breath, cough etc.) before then, please call [phone].  If you test positive for Covid 19 in the 2 weeks post procedure, please call and report this information to [place].    If any biopsies were taken you will be contacted by phone or by letter within the next 1-3 weeks.  Please call us at [phone] if you have not heard about the biopsies in 3 weeks.    SIGNATURES/CONFIDENTIALITY: You and/or your care partner have signed paperwork which will be entered into your electronic medical record.  These signatures attest to the fact that that the information above on your After Visit Summary has been reviewed and is understood.  Full responsibility of the confidentiality  of this discharge information lies with you and/or your care-partner.

## 2021-11-17 NOTE — Op Note (Signed)
[place] Patient Name: Julia Lee Procedure Date: 11/17/2021 1:28 PM MRN: [id number] Endoscopist: Remo Lipps P. Havery Moros , MD Age: 71 Referring MD:  Date of Birth: Feb 27, 1951 Gender: Female Account #: [id number] Procedure:                Upper GI endoscopy Indications:              Epigastric abdominal pain, Dysphagia (feels tight                            in throat), Weight loss - trial of PPI has not                            helped Medicines:                Monitored Anesthesia Care Procedure:                Pre-Anesthesia Assessment:                           - Prior to the procedure, a History and Physical                            was performed, and patient medications and                            allergies were reviewed. The patient's tolerance of                            previous anesthesia was also reviewed. The risks                            and benefits of the procedure and the sedation                            options and risks were discussed with the patient.                            All questions were answered, and informed consent                            was obtained. Prior Anticoagulants: The patient has                            taken no previous anticoagulant or antiplatelet                            agents. ASA Grade Assessment: III - A patient with                            severe systemic disease. After reviewing the risks                            and benefits, the patient was deemed in  satisfactory condition to undergo the procedure.                           After obtaining informed consent, the endoscope was                            passed under direct vision. Throughout the                            procedure, the patient's blood pressure, pulse, and                            oxygen saturations were monitored continuously. The                            Endoscope was introduced through the  mouth, and                            advanced to the second part of duodenum. The upper                            GI endoscopy was accomplished without difficulty.                            The patient tolerated the procedure well. Scope In: Scope Out: Findings:                 Esophagogastric landmarks were identified: the                            Z-line was found at 41 cm, the gastroesophageal                            junction was found at 41 cm and the upper extent of                            the gastric folds was found at 41 cm from the                            incisors.                           The exam of the esophagus was otherwise normal. No                            focal stricture / stenosis noted                           A guidewire was placed and the scope was withdrawn.                            Empiric dilation was performed in the entire  esophagus with a Savary dilator with mild                            resistance at 17 mm. Relook endoscopy showed no                            mucosal wrents.                           The entire examined stomach was normal. Biopsies                            were taken with a cold forceps for Helicobacter                            pylori testing.                           The duodenal bulb and second portion of the                            duodenum were normal. Biopsies for histology were                            taken with a cold forceps for evaluation of celiac                            disease. Complications:            No immediate complications. Estimated blood loss:                            Minimal. Estimated Blood Loss:     Estimated blood loss was minimal. Impression:               - Esophagogastric landmarks identified.                           - Normal esophagus otherwise - no inflammatory                            changes. Empirically dilated to 15mm with good                             result.                           - Normal stomach. Biopsied to rule out H pylori.                           - Normal duodenal bulb and second portion of the                            duodenum. Biopsied to rule out celiac disease. Recommendation:           - Patient has a contact number available for  emergencies. The signs and symptoms of potential                            delayed complications were discussed with the                            patient. Return to normal activities tomorrow.                            Written discharge instructions were provided to the                            patient.                           - Resume previous diet.                           - Continue present medications.                           - Await pathology results.                           - Await pending CT scan scheduled for later this                            week to further evaluate epigastric pain / weight                            loss Remo Lipps P. Havery Moros, MD 11/17/2021 2:20:16 PM This report has been signed electronically.

## 2021-11-17 NOTE — Progress Notes (Signed)
DT - VS   Pt is nervous.  Has a mild tremor to her arms.  maw

## 2021-11-19 ENCOUNTER — Telehealth: Payer: Self-pay

## 2021-11-19 ENCOUNTER — Ambulatory Visit (HOSPITAL_COMMUNITY)
Admission: RE | Admit: 2021-11-19 | Discharge: 2021-11-19 | Disposition: A | Discharge status: Home / Self Care | Payer: Medicare PPO | Source: Ambulatory Visit | Attending: Nurse Practitioner | Admitting: Nurse Practitioner

## 2021-11-19 ENCOUNTER — Other Ambulatory Visit: Payer: Self-pay

## 2021-11-19 ENCOUNTER — Encounter (HOSPITAL_COMMUNITY): Payer: Self-pay

## 2021-11-19 DIAGNOSIS — R1013 Epigastric pain: Secondary | ICD-10-CM | POA: Diagnosis not present

## 2021-11-19 DIAGNOSIS — R634 Abnormal weight loss: Secondary | ICD-10-CM

## 2021-11-19 DIAGNOSIS — R109 Unspecified abdominal pain: Secondary | ICD-10-CM | POA: Diagnosis not present

## 2021-11-19 MED ORDER — SODIUM CHLORIDE (PF) 0.9 % IJ SOLN
INTRAMUSCULAR | Status: AC
  Filled 2021-11-19: qty 50

## 2021-11-19 MED ORDER — IOHEXOL 300 MG/ML  SOLN
100.0000 mL | Freq: Once | INTRAMUSCULAR | Status: AC | PRN
  Administered 2021-11-19: 100 mL via INTRAVENOUS

## 2021-11-19 NOTE — Telephone Encounter (Signed)
Second attempt follow up call to pt, no answer.  

## 2021-11-19 NOTE — Telephone Encounter (Signed)
First attempt follow up call to pt, lm for pt to call if having any problems or questions, otherwise we will call them back later this morning or early this afternoon.  °

## 2021-11-20 ENCOUNTER — Other Ambulatory Visit: Payer: Self-pay

## 2021-11-20 DIAGNOSIS — R1013 Epigastric pain: Secondary | ICD-10-CM

## 2021-11-20 DIAGNOSIS — R634 Abnormal weight loss: Secondary | ICD-10-CM

## 2021-11-27 DIAGNOSIS — Z79899 Other long term (current) drug therapy: Secondary | ICD-10-CM | POA: Diagnosis not present

## 2021-12-03 ENCOUNTER — Telehealth: Payer: Self-pay

## 2021-12-03 ENCOUNTER — Other Ambulatory Visit: Payer: Self-pay | Admitting: Internal Medicine

## 2021-12-03 MED ORDER — ZOLMITRIPTAN 5 MG PO TABS: 5.0000 mg | ORAL_TABLET | ORAL | 0 refills | Status: DC | PRN

## 2021-12-03 NOTE — Telephone Encounter (Signed)
Patient states that she is out of zomig and it is not covered by her insurance. She wouldlike you to send in immitrex. CPE scheduled for June.

## 2021-12-03 NOTE — Telephone Encounter (Signed)
Patient states that she is out of zomig and it is not covered by her insurance. She wouldlike you to send in immitrex. CPE scheduled for June.     I have called patient.They had to pay $600 for Zomig nasal spray as apparently was not covered. I am sending in Generic Zomig tablets, MJB, MD         Original Signed Note   In Progress      Patient states that she is out of zomig and it is not covered by her insurance. She wouldlike you to send in immitrex. CPE scheduled for June.          Revision History

## 2021-12-08 DIAGNOSIS — F3174 Bipolar disorder, in full remission, most recent episode manic: Secondary | ICD-10-CM | POA: Diagnosis not present

## 2021-12-08 DIAGNOSIS — F411 Generalized anxiety disorder: Secondary | ICD-10-CM | POA: Diagnosis not present

## 2021-12-14 ENCOUNTER — Other Ambulatory Visit: Payer: Self-pay | Admitting: Endocrinology

## 2021-12-14 ENCOUNTER — Telehealth: Payer: Self-pay | Admitting: Internal Medicine

## 2021-12-14 ENCOUNTER — Ambulatory Visit: Payer: Medicare PPO | Admitting: Internal Medicine

## 2021-12-14 ENCOUNTER — Encounter: Payer: Self-pay | Admitting: Internal Medicine

## 2021-12-14 ENCOUNTER — Other Ambulatory Visit: Payer: Self-pay

## 2021-12-14 VITALS — BP 160/102 | HR 92 | Temp 98.9°F | Ht 63.5 in | Wt 118.2 lb

## 2021-12-14 DIAGNOSIS — G44209 Tension-type headache, unspecified, not intractable: Secondary | ICD-10-CM | POA: Diagnosis not present

## 2021-12-14 DIAGNOSIS — E213 Hyperparathyroidism, unspecified: Secondary | ICD-10-CM

## 2021-12-14 DIAGNOSIS — T452X1D Poisoning by vitamins, accidental (unintentional), subsequent encounter: Secondary | ICD-10-CM

## 2021-12-14 MED ORDER — CYCLOBENZAPRINE HCL 10 MG PO TABS: ORAL_TABLET | ORAL | 0 refills | Status: DC

## 2021-12-14 MED ORDER — HYDROCODONE-ACETAMINOPHEN 5-325 MG PO TABS: 1.0000 | ORAL_TABLET | Freq: Four times a day (QID) | ORAL | 0 refills | Status: DC | PRN

## 2021-12-14 NOTE — Telephone Encounter (Signed)
Julia Lee ?[phone] ? ?[name] left message on VM that she was having Shooting left side pain in head, not normal migraines, she has taking her Zomieg and it has not helped.   ?

## 2021-12-14 NOTE — Progress Notes (Signed)
? ?  Subjective:  ? ? Patient ID: Julia Lee, female    DOB: [birth year], 71 y.o.   MRN: [id number] ? ?HPI 71 year old Female with protracted headache/ pain left occipital area for about 3-4 weeks. No vomiting. Pain is throbbing. Not responding to migraine medication. No vomiting.  She is in the process of tapering off of lithium.  She has been on lithium for many years for Bipolar disorder.  She is seeing a psychiatrist.  Her daughter accompanies her today.  Patient does exercise on a regular basis.  She has no visual disturbance.  Denies any heavy lifting or injury to the neck area .Pain is in the left occipital area and does not radiate.  Cannot get any relief.  Has tried over-the-counter analgesics without success.  I have recommended ice or heat for 20 minutes at a time to see if she can obtain some relief. ? ?Initial episode of bipolar disorder managed by Dr. Bunnie Pion, Psychiatrist in 1992.  Was started on lithium at that time and had excellent response.  She has done well with this for many years. ?Seen by endocrinologist for hypercalcemia.  ? ? Her calcium was 11.5 when Dr. Dwyane Dee checked it on February 3.   His office contacted her but apparently  it was not convenient for her to speak at that time.Calcium was 10.6 when checked Nov. 10th, 2022. ? ?Hyperparathyroidism could explain some of the agitation she is having currently.  I have spoken with her daughter and advised that they contact Dr. Dwyane Dee right away.She needs PTH checked and parathyroid hormone assay. PTH was 70 in May 2022 and 47 in Feb. 2021. ? ? ? ? ?Review of Systems see above ? ?   ?Objective:  ? Physical Exam ?She is tremulous in the office today.  She seems anxious and cannot sit for a prolonged period of time.  Blood pressure is 160/102 which is unusual for her ;pulse 92 ;temperature 98.9 degrees; pulse oximetry 98% ;weight 118 pounds 4 ounces BMI 20.62 ?Extraocular movements are full.  PERRLA.  Cranial nerves II through XII are grossly  intact.  TMs are dull and chronically scarred bilaterally.  Moves all 4 extremities.  Speech is normal.  No facial weakness. ? ?There is no palpable tenderness to the left parietal /occipital area.  No masses palpable. ? ? ?   ?Assessment & Plan:  ?Protracted muscle contraction headache left parieto-occipital area ? ?History of bipolar disorder-being seen for medication management by psychiatrist ? ?Anxiety state-cannot get any relief from pain; ? ?Hypercalcemia- needs to be addressed as it could be causing some of her symptoms ? ?Plan: She does not want to take steroids which would likely help any inflammation in the left parieto-occipital area.  We are going to try Flexeril 10 mg 1/2 to 1 tablet up to 3 times daily as needed for pain.  She will also take hydrocodone APAP 5/325 sparingly for pain.  If she is not improving in 5 days, she is to call me.  We may need to consider Neurology referral and CNS imaging. Daughter is going to call Dr. Ronnie Derby office right away. Other option is evaluation in ED. ? ?Addendum:  Our front office manager says Dr. Dwyane Dee is seeing patient early tomorrow am. ? ?

## 2021-12-14 NOTE — Telephone Encounter (Signed)
After checking with Dr Renold Genta, she said schedule an appointment. ?

## 2021-12-14 NOTE — Patient Instructions (Addendum)
Try applying heat or ice to the scalp area.  May take Flexeril up to 3 times daily if needed for pain.  May take hydrocodone APAP 5/325 sparingly with food up to 3 times daily as needed for pain.  Call if not better in 5 days or sooner if worse.  Your neurological exam is essentially normal.Please call Dr. Ronnie Derby office right away.Other option is [facility] to evaluate hypercalcemia and agitation. ?

## 2021-12-15 ENCOUNTER — Other Ambulatory Visit (INDEPENDENT_AMBULATORY_CARE_PROVIDER_SITE_OTHER): Payer: Medicare PPO

## 2021-12-15 ENCOUNTER — Other Ambulatory Visit: Payer: Self-pay

## 2021-12-15 ENCOUNTER — Ambulatory Visit: Payer: Medicare PPO | Admitting: Endocrinology

## 2021-12-15 DIAGNOSIS — T452X1D Poisoning by vitamins, accidental (unintentional), subsequent encounter: Secondary | ICD-10-CM | POA: Diagnosis not present

## 2021-12-15 DIAGNOSIS — E213 Hyperparathyroidism, unspecified: Secondary | ICD-10-CM

## 2021-12-15 LAB — BASIC METABOLIC PANEL
BUN: 17 mg/dL (ref 6–23)
CO2: 25 mEq/L (ref 19–32)
Calcium: 11.2 mg/dL — ABNORMAL HIGH (ref 8.4–10.5)
Chloride: 102 mEq/L (ref 96–112)
Creatinine, Ser: 0.78 mg/dL (ref 0.40–1.20)
GFR: 77.15 mL/min (ref >60.00)
Glucose, Bld: 109 mg/dL — ABNORMAL HIGH (ref 70–99)
Potassium: 4.8 mEq/L (ref 3.5–5.1)
Sodium: 136 mEq/L (ref 135–145)

## 2021-12-15 LAB — VITAMIN D 25 HYDROXY (VIT D DEFICIENCY, FRACTURES): VITD: 73.65 ng/mL (ref 30.00–100.00)

## 2021-12-15 NOTE — Telephone Encounter (Signed)
Patients daughter states that the patient never took the lorazepam that she was given that she flushed it. She has increased anxiety and panic attacks and would like to try taking it now. Please resend.  ?

## 2021-12-15 NOTE — Telephone Encounter (Signed)
Patient has been notified and will talk to the psych that she sees in regards to medication.  ?

## 2021-12-16 ENCOUNTER — Encounter: Payer: Self-pay | Admitting: Endocrinology

## 2021-12-16 ENCOUNTER — Ambulatory Visit: Payer: Medicare PPO | Admitting: Endocrinology

## 2021-12-16 ENCOUNTER — Other Ambulatory Visit: Payer: Self-pay | Admitting: Endocrinology

## 2021-12-16 VITALS — BP 154/100 | HR 81 | Ht 63.5 in | Wt 118.0 lb

## 2021-12-16 DIAGNOSIS — E213 Hyperparathyroidism, unspecified: Secondary | ICD-10-CM | POA: Diagnosis not present

## 2021-12-16 DIAGNOSIS — M818 Other osteoporosis without current pathological fracture: Secondary | ICD-10-CM

## 2021-12-16 NOTE — Progress Notes (Signed)
Patient ID: Julia Lee, female   DOB: 1951/03/24, 71 y.o.   MRN: [id number] ? ?       ? ? ?Chief complaint: Follow-up of calcium and osteoporosis ? ?History of Present Illness: ? ?Referring physician: Tedra Senegal ? ?HYPERCALCEMIA: ? ?She is here for follow-up because of her calcium going up to 11.5 last month ? ?Also she is concerned about recent fatigue and some brain fog symptoms but no nausea or drowsiness ?Review of records show that she has had a high calcium since 2012. ?  ? ?Lab Results  ?Component Value Date  ? CALCIUM 11.2 (H) 12/15/2021  ? CALCIUM 11.5 (H) 11/13/2021  ? CALCIUM 10.6 (H) 08/20/2021  ? CALCIUM 11.1 (H) 02/20/2021  ? CALCIUM 10.8 (H) 02/17/2021  ? CALCIUM 10.8 (H) 11/09/2019  ? CALCIUM 10.4 02/07/2018  ? CALCIUM 10.5 (H) 01/31/2017  ? CALCIUM 10.6 (H) 02/24/2016  ? ? ?The hypercalcemia is not associated with any history of fractures, renal insufficiency, history of kidney stones  ?Has been diagnosed to have primary hyperparathyroidism ? ?OSTEOPOROSIS: ?She thinks she may have lost only less than an inch in height, previously was 5 feet 3.75 inches tall ?Bone density results as below ? ?Age at menopause was 20-50; no previous history of HRT ? ?Because of the lowest T score of -3.1 she was given Actonel 150 mg weekly ?Because of her having symptoms of diarrhea and some reflux for a few days she stopped taking it in 11/22 ? ?She was supposed to get Reclast infusion but she has not scheduled this yet ? ?Lab Results  ?Component Value Date  ? PTH 70 02/20/2021  ? CALCIUM 11.2 (H) 12/15/2021  ? CAION 1.36 (H) 06/18/2012  ? ?   ?25 (OH) Vitamin D supplements: On 800 U daily ? ?Lab Results  ?Component Value Date  ? VD25OH 73.65 12/15/2021  ? VD25OH 105.30 ([place]) 08/20/2021  ? ? Results of bone density study on 03/20/2021: ? ?  Lumbar spine L1-L4 Femoral neck (FN) 33% distal radius Ultradistal radius  ?T-score -1.4 RFN: -2.9 ?LFN: -3.1 -3.1 -3.1  ? ? ? ?Allergies as of 12/16/2021   ? ?   Reactions  ?  Divalproex Sodium Other (See Comments)  ? sluggish  ? Oxycodone Nausea Only  ? Topamax Other (See Comments)  ? Memory loss  ? Gabapentin Other (See Comments)  ? Low mood, intense sadness  ? ?  ? ?  ?Medication List  ?  ? ?  ? Accurate as of December 16, 2021  8:38 AM. If you have any questions, ask your nurse or doctor.  ?  ?  ? ?  ? ?aspirin EC 325 MG tablet ?Take 650 mg by mouth as needed. ?  ?b complex vitamins tablet ?Take 1 tablet by mouth daily. ?  ?cyclobenzaprine 10 MG tablet ?Commonly known as: FLEXERIL ?One half to one tablet up to 3 times daily for headache ?  ?Fish Oil 1000 MG Caps ?Take 1 capsule by mouth daily. ?  ?HYDROcodone-acetaminophen 5-325 MG tablet ?Commonly known as: Norco ?Take 1 tablet by mouth every 6 (six) hours as needed for moderate pain. ?  ?hydrOXYzine 25 MG capsule ?Commonly known as: VISTARIL ?Take by mouth. ?  ?lithium carbonate 300 MG capsule ?TAKE 1 CAPSULE BY MOUTH AT NIGHT ?  ?lithium carbonate 450 MG CR tablet ?Commonly known as: ESKALITH ?TAKE 1 TABLET BY MOUTH EVERY DAY IN THE MORNING ?  ?LORazepam 1 MG tablet ?Commonly known as: ATIVAN ?One half  to one tab twice daily as needed for anxiety ?  ?magnesium oxide 400 MG tablet ?Commonly known as: MAG-OX ?Take 400 mg by mouth daily. ?  ?nystatin ointment ?Commonly known as: MYCOSTATIN ?Apply topically. ?  ?nystatin-triamcinolone ointment ?Commonly known as: MYCOLOG ?Apply 1 application topically 2 (two) times daily. ?  ?pantoprazole 40 MG tablet ?Commonly known as: PROTONIX ?Take 1 tablet (40 mg total) by mouth daily. ?  ?vitamin C 1000 MG tablet ?Take 1,000 mg by mouth daily. ?  ?Vitamin D 1000 units capsule ?Take 1,000 Units by mouth daily. ?  ?zolmitriptan 5 MG tablet ?Commonly known as: Zomig ?Take 1 tablet (5 mg total) by mouth as needed for migraine. ?  ? ?  ? ? ?Allergies:  ?Allergies  ?Allergen Reactions  ? Divalproex Sodium Other (See Comments)  ?  sluggish  ? Oxycodone Nausea Only  ? Topamax Other (See Comments)  ?  Memory  loss  ? Gabapentin Other (See Comments)  ?  Low mood, intense sadness  ? ? ?Past Medical History:  ?Diagnosis Date  ? Anxiety   ? Bipolar disorder ([place])   ? Cancer [facility])   ? rt arm squamous cell ca  ? History of migraine headaches   ? History of recurrent UTIs   ? Migraines   ? Osteoporosis   ? ? ?Past Surgical History:  ?Procedure Laterality Date  ? COLONOSCOPY    ? ROTATOR CUFF REPAIR Left   ? SKIN CANCER EXCISION    ? TOE SURGERY    ? ? ?Family History  ?Problem Relation Age of Onset  ? Pancreatic cancer Mother   ? Heart disease Father   ? Suicidality Sister   ? Breast cancer Maternal Grandmother   ? Diabetes Maternal Grandmother   ? Colon cancer Neg Hx   ? Colon polyps Neg Hx   ? Esophageal cancer Neg Hx   ? Stomach cancer Neg Hx   ? Rectal cancer Neg Hx   ? Thyroid disease Neg Hx   ? ? ?Social History:  reports that she has never smoked. She has never used smokeless tobacco. She reports current alcohol use of about 14.0 standard drinks per week. She reports that she does not use drugs. ? ?Review of Systems ?  ?Asking about numbness in feet for the last month ? ?She appears to have lost weight since 11/22 ? ?Wt Readings from Last 3 Encounters:  ?12/16/21 118 lb (53.5 kg)  ?12/14/21 118 lb 4 oz (53.6 kg)  ?11/17/21 123 lb (55.8 kg)  ? ? ? ?EXAM: ? ?BP (!) 154/100   Pulse 81   Ht 5' 3.5" (1.613 m)   Wt 118 lb (53.5 kg)   SpO2 98%   BMI 20.57 kg/m?  ? ?  ? ? ?Assessment/Plan:  ? 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 ?She has had persistent hypercalcemia for over 10 years at least ? ?Her calcium is relatively higher at 11.3 although was 11.5 last month ?Has had calciums as high as 11.1 previously also ?Not clear if this higher calcium has been related to above normal vitamin D level also ?No history of kidney stones ?  ?OSTEOPOROSIS: She had stopped Actonel ?Was supposed to start Reclast but she has not scheduled this yet ?No contraindication to this ? ?Plan: ? ?We will schedule her for parathyroid scan  as there is a likelihood that she may need parathyroid surgery in the future.  Discussed nature of hyperparathyroidism and location of parathyroid glands, likelihood of single adenoma that  could be surgically removed ?If we are able to do a bone density approved through her insurance we can schedule this now otherwise wait till June ?Encouraged her to increase fluid intake ? ?High vitamin D levels: This is back to normal with stopping her vitamin D supplement ? ? ? ?Elayne Snare ?12/16/2021, 8:38 AM  ? ?

## 2021-12-16 NOTE — Patient Instructions (Addendum)
Please call [phone] to schedule a Bone Density (DexaScan) a the [place] office at [address]  

## 2021-12-17 ENCOUNTER — Encounter: Payer: Self-pay | Admitting: Endocrinology

## 2021-12-23 DIAGNOSIS — F4323 Adjustment disorder with mixed anxiety and depressed mood: Secondary | ICD-10-CM | POA: Diagnosis not present

## 2021-12-23 DIAGNOSIS — F3174 Bipolar disorder, in full remission, most recent episode manic: Secondary | ICD-10-CM | POA: Diagnosis not present

## 2021-12-23 DIAGNOSIS — F411 Generalized anxiety disorder: Secondary | ICD-10-CM | POA: Diagnosis not present

## 2021-12-24 ENCOUNTER — Ambulatory Visit: Payer: Medicare PPO | Admitting: Internal Medicine

## 2021-12-24 ENCOUNTER — Other Ambulatory Visit: Payer: Self-pay

## 2021-12-24 ENCOUNTER — Encounter: Payer: Self-pay | Admitting: Internal Medicine

## 2021-12-24 VITALS — BP 162/100 | HR 97 | Temp 98.9°F

## 2021-12-24 DIAGNOSIS — E876 Hypokalemia: Secondary | ICD-10-CM | POA: Diagnosis not present

## 2021-12-24 DIAGNOSIS — R519 Headache, unspecified: Secondary | ICD-10-CM | POA: Diagnosis not present

## 2021-12-24 DIAGNOSIS — E210 Primary hyperparathyroidism: Secondary | ICD-10-CM

## 2021-12-24 DIAGNOSIS — Z8659 Personal history of other mental and behavioral disorders: Secondary | ICD-10-CM

## 2021-12-24 NOTE — Patient Instructions (Signed)
C-Met drawn stat and results are pending.  May need evaluation in the emergency department with protracted recurrent headache.  Is scheduled for parathyroid scan on Monday, March 20 for hypercalcemia and he is suspected of having hyperparathyroidism. ?

## 2021-12-24 NOTE — Progress Notes (Addendum)
? ?  Subjective:  ? ? Patient ID: Julia Lee, female    DOB: 11/14/1950, 71 y.o.   MRN: [id number] ? ?HPI 71 year old Female with hypercalcemia scheduled for Parathyroid  nuclear medicine scan this coming Monday March 20th.Is being seen by Dr. Dwyane Dee for this issue. ? ? She also has history of bipolar disorder and migraine headaches.  Initial episode of bipolar illness was diagnosed by Dr. Erick Alley, Psychiatrist in [phone].  She was started on lithium at the time and had an excellent response.  Dr. Lajuana Ripple saw her in 2006 and recommended continuing lithium.  She has done well with this for many years and has had no side effects. ? ?In May 2022 she was referred to Dr. Dwyane Dee for evaluation of hypercalcemia.  We have been watching this for couple of years and parathyroid adenoma was ruled out with normal PTH assay in 2021 in 2022.  It was felt that perhaps lithium therapy was the cause of her hypercalcemia.  At the time she was asymptomatic with regard to hypercalcemia.  She has history of mild tremor and saw Dr. Aida Puffer in 2018 who felt her tremor was result of lithium therapy and did not recommend the patient's stop or taper the lithium. ? ?Social history: She is married.  She has 1 daughter who resides in [place].  1 grandson.  Husband is a Professor in the [employer] at [employer].  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 ? ?Family history: Mother died at age 68 of pancreatic cancer.  Father died at age 5 of an MI. ? ?Was seen here March 6th with protracted headache. At that time, headache  was in left parieto- occipital area. Was thought at that time to have muscle contraction headache. Was prescribed Norco 5/325 and Flexeril. ? ?Husband says she was up all last night with headache. Is being seen by [facility] and recently started Seroquel for sleep. ? ? ? ?Review of Systems see above. Not vomiting. Agitated and seems to  have some difficulty concentrating. ? ?   ?Objective:  ? Physical Exam ? BP 162/100 pulse 97 T 98.9 degrees pulse ox 99% ?She is anxious and agitated.  Moving all 4 extremities.  Recognizes me.  No facial weakness.  She is alert.  Tremulous. ? ? ?   ?Assessment & Plan:  ?Hypercalcemia-serum calcium drawn today stat and results are pending.  Suspected of having hyperparathyroidism.  PTH was 70 when checked in May 2022 ? ?Protracted and recurrent headaches.  History of migraine headache.  May need CNS imaging ? ?History of bipolar disorder-recently started on Seroquel as she has not been sleeping. ? ?Plan: Awaiting C-met results. Consider CT imaging of head with persistent headaches ? ? ?Addendum: March 22,2023- spoke with husband by phone. He would like for her to have a Neurology evaluation. Currently counselor at [facility] is considering in-patient treatment for her. Headaches are continuing. ? ?

## 2021-12-25 ENCOUNTER — Telehealth: Payer: Self-pay | Admitting: Internal Medicine

## 2021-12-25 LAB — COMPLETE METABOLIC PANEL WITH GFR
AG Ratio: 2 (calc) (ref 1.0–2.5)
ALT: 11 U/L (ref 6–29)
AST: 11 U/L (ref 10–35)
Albumin: 4.3 g/dL (ref 3.6–5.1)
Alkaline phosphatase (APISO): 30 U/L — ABNORMAL LOW (ref 37–153)
BUN: 21 mg/dL (ref 7–25)
CO2: 24 mmol/L (ref 20–32)
Calcium: 11.1 mg/dL — ABNORMAL HIGH (ref 8.6–10.4)
Chloride: 104 mmol/L (ref 98–110)
Creat: 0.99 mg/dL (ref 0.60–1.00)
Globulin: 2.2 g/dL (calc) (ref 1.9–3.7)
Glucose, Bld: 104 mg/dL — ABNORMAL HIGH (ref 65–99)
Potassium: 5.3 mmol/L (ref 3.5–5.3)
Sodium: 136 mmol/L (ref 135–146)
Total Bilirubin: 0.4 mg/dL (ref 0.2–1.2)
Total Protein: 6.5 g/dL (ref 6.1–8.1)
eGFR: 61 mL/min/{1.73_m2} (ref >=60)

## 2021-12-25 LAB — UNLABELED: Test Ordered On Req: 10231

## 2021-12-25 LAB — PAT ID TIQ DOC: Test Affected: 10231

## 2021-12-25 NOTE — Telephone Encounter (Signed)
Have sent signed copy of requested test and awaiting Calcium level to be run STAT. We have spoken with [facility] lab staff at least twice this am. MJB, MD ?

## 2021-12-28 ENCOUNTER — Other Ambulatory Visit: Payer: Self-pay

## 2021-12-28 ENCOUNTER — Ambulatory Visit (HOSPITAL_COMMUNITY): Payer: Medicare PPO

## 2021-12-28 ENCOUNTER — Encounter (HOSPITAL_COMMUNITY)
Admission: RE | Admit: 2021-12-28 | Discharge: 2021-12-28 | Disposition: A | Discharge status: Home / Self Care | Payer: Medicare PPO | Source: Ambulatory Visit | Attending: Endocrinology | Admitting: Endocrinology

## 2021-12-28 DIAGNOSIS — E213 Hyperparathyroidism, unspecified: Secondary | ICD-10-CM | POA: Insufficient documentation

## 2021-12-28 MED ORDER — TECHNETIUM TC 99M SESTAMIBI - CARDIOLITE
23.0000 | Freq: Once | INTRAVENOUS | Status: AC
  Administered 2021-12-28: 23 via INTRAVENOUS

## 2021-12-30 ENCOUNTER — Telehealth: Payer: Self-pay | Admitting: Internal Medicine

## 2021-12-30 ENCOUNTER — Encounter: Payer: Self-pay | Admitting: Internal Medicine

## 2021-12-30 MED ORDER — ZOLMITRIPTAN 5 MG PO TABS: 5.0000 mg | ORAL_TABLET | ORAL | 0 refills | Status: DC | PRN

## 2021-12-30 MED ORDER — LORAZEPAM 1 MG PO TABS: 1.0000 mg | ORAL_TABLET | Freq: Three times a day (TID) | ORAL | 1 refills | Status: DC | PRN

## 2021-12-30 NOTE — Telephone Encounter (Signed)
Spoke with husband this evening. She has appt for evaluation in [facility] with psychiatry tomorrow for possible in-patient treatment. Remains extremely anxious. Tried to call her therapist and left a voice mail message. Husband would like her headaches evaluated as soon as possible. Had refilled Zomig and Lorazepam at his request. ?MJB,MD ?

## 2021-12-31 ENCOUNTER — Other Ambulatory Visit: Payer: Medicare PPO

## 2021-12-31 ENCOUNTER — Telehealth: Payer: Self-pay | Admitting: Internal Medicine

## 2021-12-31 ENCOUNTER — Ambulatory Visit: Payer: Medicare PPO | Admitting: Internal Medicine

## 2021-12-31 NOTE — Telephone Encounter (Signed)
Called and let Dr Willaim Bane know that the appointment with [facility] was 01/18/2022 at 8:00 am.  ?He wanted me to let Dr Renold Genta know that the Intake for [facility] had change for 01/01/2022 at 11:00.  ?

## 2021-12-31 NOTE — Telephone Encounter (Signed)
Dr Lamonte Richer called back to say he was not going to bring patient in, she was sleeping and he wanted to let her rest, since she was finally resting. ?

## 2021-12-31 NOTE — Telephone Encounter (Signed)
I called and schedule patient for today since [place] appointment had been changed for tomorrow, Dr Lamonte Richer said he would try and bring her, however he had gave her a Zomig for her headache and she was finally sleeping and he did not know if he could wake her to bring her in.  ?

## 2022-01-05 ENCOUNTER — Other Ambulatory Visit: Payer: Self-pay | Admitting: Endocrinology

## 2022-01-05 ENCOUNTER — Telehealth: Payer: Self-pay

## 2022-01-05 DIAGNOSIS — M818 Other osteoporosis without current pathological fracture: Secondary | ICD-10-CM

## 2022-01-05 DIAGNOSIS — E213 Hyperparathyroidism, unspecified: Secondary | ICD-10-CM

## 2022-01-05 NOTE — Telephone Encounter (Signed)
Left message with [facility] that bone density does not require prior auth.  [id number]. ?

## 2022-01-05 NOTE — Progress Notes (Signed)
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  No need to consider surgery at this time.  Will order bone density and see if this is going to be covered, prior authorization to be done for bone density

## 2022-01-07 ENCOUNTER — Encounter: Payer: Self-pay | Admitting: Internal Medicine

## 2022-01-07 ENCOUNTER — Telehealth (INDEPENDENT_AMBULATORY_CARE_PROVIDER_SITE_OTHER): Payer: Medicare PPO | Admitting: Internal Medicine

## 2022-01-07 ENCOUNTER — Other Ambulatory Visit: Payer: Medicare PPO

## 2022-01-07 ENCOUNTER — Other Ambulatory Visit: Payer: Self-pay | Admitting: Internal Medicine

## 2022-01-07 DIAGNOSIS — F411 Generalized anxiety disorder: Secondary | ICD-10-CM | POA: Diagnosis not present

## 2022-01-07 DIAGNOSIS — Z8659 Personal history of other mental and behavioral disorders: Secondary | ICD-10-CM | POA: Diagnosis not present

## 2022-01-07 DIAGNOSIS — Z8669 Personal history of other diseases of the nervous system and sense organs: Secondary | ICD-10-CM

## 2022-01-07 MED ORDER — ZOLMITRIPTAN 5 MG NA SOLN: 1.0000 | NASAL | 0 refills | Status: DC | PRN

## 2022-01-07 NOTE — Progress Notes (Addendum)
? ?Subjective:  ? ? Patient ID: Julia Lee, female    DOB: [birth year], 71 y.o.   MRN: [id number] ? ?HPI 71 year old Female with anxiety and migraine headaches.  She has a history of bipolar disorder.  She was referred to [facility] for medication evaluation and management.  Unfortunately has not shown much improvement of the past few weeks.  She continues to have chronic daily headaches.  She has an appointment soon with Neurology.  Longstanding history of migraine headaches. ? ?She went for an interview and a look at an in-patient psychiatric facility near [place].  She and her husband did not realize that it also serves as a county facility and multiple inmates were coming there for evaluation.  This was upsetting to the patient.  She decided not to move forward with that program. ? ?Her daughter works at [employer] and has a contact there with a Psychiatrist who is going to see her soon.  I still think she would benefit from intensive in-patient therapy.  Her husband speaks for her today saying that he feels that they are getting along okay although it has not been easy.  She gets frequent headaches that normal for several hours.  Sodium tablets are not working and he requests that I send in Zomig nasal spray which I have done. ? ?There is also a history of hypercalcemia.  She has a history of osteoporosis Parathyroid scan was negative.Last calcium drawn was 11.1 on March 16. Endocrinologist here did not think that could cause behavioral issues. ? ?She is on lithium and has been on lithium for a number of years ever since her diagnosis of bipolar disorder.  Recently tried psych tried various medications without success. ? ?She has a history of GE reflux treated with Protonix.  She takes hydrocodone APAP sparingly for severe headaches.  Husband is asking that we send in Zomig nasal spray which he believes would work faster than the Zomig tablets.  We will do this today.  She also has on  hand Ativan 1 mg to take every 8 hours if needed for agitation.  We did try Flexeril in early March for musculoskeletal pain associated with headache.  I felt this might be a muscle contraction headache and Flexeril might be of some help. ? ?Currently on lithium carbonate 450 mg CR daily.  Also has Flexeril on hand if needed for headache/muscle contraction/neck pain. ? ?Patient and husband are at home, and I am at my office. They are identified as Campbell Stall and Betsey Holiday. She has 2 identifiers. I have known them for many years.They are agreeable to this visit in this format. This is an Scientist, research (physical sciences) visit. ? ?We discussed what the pros and cons are for in-patient therapy. There would be several types of therapy available as well as opportunity for interactions with other patients in a controlled environment with professionals available at all times.There would be time for observation and getting to know the patient in depth and create a specific plan for her. I think the headaches are being triggered by stress. I would hope with medication, these would improve as well. I am concerned there has been a rebound effect with the medication for headaches. The headaches are much too frequent and interfere with her progress. She does have a Neurology evaluation soon on April 10th. ? ?Review of Systems frequent migraines treated with Zomig ? ?   ?Objective:  ? Physical Exam ? ?  Not examined- this is a discussion about her medical issues- continues to be anxious and having numerous migraines ? ? ?   ?Assessment & Plan:  ?Husband tells me they would like to see if Zomig nasal Spray will help as opposed to tablets. This Rx has been sent. ? ?He has some concerns still about hypercalcemia causing these issues. He may be able to find Endocrinologist in [facility] as well. He will keep me informed. ? ?

## 2022-01-11 NOTE — Patient Instructions (Signed)
Zomig nasal spray prescribed. Husband will keep me informed about her condition and if she is able to be evaluated in [facility]. ?

## 2022-01-12 ENCOUNTER — Ambulatory Visit (INDEPENDENT_AMBULATORY_CARE_PROVIDER_SITE_OTHER)
Admission: RE | Admit: 2022-01-12 | Discharge: 2022-01-12 | Disposition: A | Discharge status: Home / Self Care | Payer: Medicare PPO | Source: Ambulatory Visit | Attending: Endocrinology | Admitting: Endocrinology

## 2022-01-12 DIAGNOSIS — M818 Other osteoporosis without current pathological fracture: Secondary | ICD-10-CM

## 2022-01-12 DIAGNOSIS — E213 Hyperparathyroidism, unspecified: Secondary | ICD-10-CM | POA: Diagnosis not present

## 2022-01-14 ENCOUNTER — Encounter: Payer: Self-pay | Admitting: Internal Medicine

## 2022-01-14 MED ORDER — ZOLMITRIPTAN 5 MG NA SOLN: 1.0000 | NASAL | 0 refills | Status: DC | PRN

## 2022-01-14 NOTE — Telephone Encounter (Signed)
My Chart message from husband asking for a refill in Zomig Nasal Spray. This will be sent. Has appt with Neurologist next week and Endocrinologist at [facility] in May. ?

## 2022-01-15 ENCOUNTER — Other Ambulatory Visit: Payer: Self-pay | Admitting: Internal Medicine

## 2022-01-18 ENCOUNTER — Ambulatory Visit: Payer: Medicare PPO | Admitting: Psychiatry

## 2022-01-18 ENCOUNTER — Encounter: Payer: Self-pay | Admitting: Psychiatry

## 2022-01-18 VITALS — BP 122/82 | Ht 63.0 in | Wt 113.0 lb

## 2022-01-18 DIAGNOSIS — G43019 Migraine without aura, intractable, without status migrainosus: Secondary | ICD-10-CM | POA: Diagnosis not present

## 2022-01-18 DIAGNOSIS — R519 Headache, unspecified: Secondary | ICD-10-CM | POA: Diagnosis not present

## 2022-01-18 DIAGNOSIS — M542 Cervicalgia: Secondary | ICD-10-CM

## 2022-01-18 MED ORDER — EMGALITY 120 MG/ML ~~LOC~~ SOAJ: 1.0000 "pen " | SUBCUTANEOUS | 3 refills | Status: DC

## 2022-01-18 MED ORDER — EMGALITY 120 MG/ML ~~LOC~~ SOAJ: 2.0000 "pen " | Freq: Once | SUBCUTANEOUS | 0 refills | Status: AC

## 2022-01-18 MED ORDER — ZOLMITRIPTAN 2.5 MG PO TABS: 2.5000 mg | ORAL_TABLET | Freq: Once | ORAL | 3 refills | Status: DC

## 2022-01-18 NOTE — Progress Notes (Signed)
? ?Referring:  ?Elby Showers, MD ?[id number] ?[place],  [place] [id number] ? ?PCP: ?Elby Showers, MD ? ?Neurology was asked to evaluate Julia Lee, a 71 year old female for a chief complaint of headaches.  Our recommendations of care will be communicated by shared medical record.   ? ?CC:  headaches ? ?History provided from self, husband ? ?HPI:  ?Medical co-morbidities: bipolar disorder, anxiety ? ?The patient presents for evaluation of headaches which began a few months ago. Around the time of her headache onset she also developed weight loss and muscle aches. She is also experiencing an increase in anxiety. States she had a lot of stress around that time as she had a bad reaction to a skin treatment. She is also currently undergoing workup for hypercalcemia of unclear etiology. ? ?In the past few months she has developed constant left occipital pain. It is described as a constant dull ache with superimposed sharp pains. This does sometimes turn into a full blown migraine with photophobia, phonophobia, and nausea. She has been taking Zomig every day for the past couple of weeks. Initially it helped, but now it seems less effective.  ? ?She also reports significant neck tension and pain. Denies radicular pain or grip weakness. ? ?She has a history of migraines 20 years ago. Had been headache free for several years until recently. ? ?Headache History: ?Onset: 3 months ago ?Triggers: light ?Aura: no ?Location: left occiput ?Quality/Description: dull ache, sharp ?Associated Symptoms: ? Photophobia: yes ? Phonophobia: yes ? Nausea: yes ?Worse with activity?: yes ?Duration of headaches: constant dull ache, severe pain can last for several hours ? ?Headache days per month: 30 ?Headache free days per month: 0 ? ?Current Treatment: ?Abortive ?Zomig 5 mg PRN ?Tylenol ? ?Preventative ?no ? ?Prior Therapies                                 ?Topamax - cognitive changes ?Depakote - drowsiness ?Gabapentin - mood  changes ?Nadolol 20 mg daily ?Zomig 5 mg PRN ?Amerge 1 mg PRN ?Imitrex ?ibuprofen ? ? ?LABS: ?CBC ?   ?Component Value Date/Time  ? WBC 7.5 11/13/2021 1027  ? RBC 4.43 11/13/2021 1027  ? HGB 14.5 11/13/2021 1027  ? HCT 43.3 11/13/2021 1027  ? PLT 301.0 11/13/2021 1027  ? MCV 97.7 11/13/2021 1027  ? MCH 31.6 02/17/2021 0941  ? MCHC 33.4 11/13/2021 1027  ? RDW 13.1 11/13/2021 1027  ? LYMPHSABS 1.4 11/13/2021 1027  ? MONOABS 0.7 11/13/2021 1027  ? EOSABS 0.2 11/13/2021 1027  ? BASOSABS 0.1 11/13/2021 1027  ? ? ?  Latest Ref Rng & Units 12/24/2021  ?  3:41 PM 12/15/2021  ?  8:26 AM 11/13/2021  ? 10:27 AM  ?CMP  ?Glucose 65 - 99 mg/dL 104   109   107    ?BUN 7 - 25 mg/dL '21   17   17    '$ ?Creatinine 0.60 - 1.00 mg/dL 0.99   0.78   0.92    ?Sodium 135 - 146 mmol/L 136   136   135    ?Potassium 3.5 - 5.3 mmol/L 5.3   4.8   4.4    ?Chloride 98 - 110 mmol/L 104   102   103    ?CO2 20 - 32 mmol/L '24   25   27    '$ ?Calcium 8.6 - 10.4 mg/dL 11.1   11.2  11.5    ?Total Protein 6.1 - 8.1 g/dL 6.5    7.0    ?Total Bilirubin 0.2 - 1.2 mg/dL 0.4    0.7    ?Alkaline Phos 39 - 117 U/L   26    ?AST 10 - 35 U/L 11    14    ?ALT 6 - 29 U/L 11    13    ? ? ? ?IMAGING:  ?none ? ?Current Outpatient Medications on File Prior to Visit  ?Medication Sig Dispense Refill  ? aspirin EC 325 MG tablet Take 650 mg by mouth as needed.    ? lithium carbonate (ESKALITH) 450 MG CR tablet TAKE 1 TABLET BY MOUTH EVERY DAY IN THE MORNING 90 tablet 0  ? lithium carbonate 300 MG capsule TAKE 1 CAPSULE BY MOUTH AT NIGHT 90 capsule 0  ? LORazepam (ATIVAN) 1 MG tablet Take 1 tablet (1 mg total) by mouth every 8 (eight) hours as needed for anxiety. 30 tablet 1  ? b complex vitamins tablet Take 1 tablet by mouth daily.    ? hydrOXYzine (VISTARIL) 25 MG capsule Take by mouth.    ? magnesium oxide (MAG-OX) 400 MG tablet Take 400 mg by mouth daily.    ? nystatin ointment (MYCOSTATIN) Apply topically.    ? nystatin-triamcinolone ointment (MYCOLOG) Apply 1 application  topically 2 (two) times daily. 30 g 0  ? Omega-3 Fatty Acids (FISH OIL) 1000 MG CAPS Take 1 capsule by mouth daily.    ? pantoprazole (PROTONIX) 40 MG tablet Take 1 tablet (40 mg total) by mouth daily. 30 tablet 3  ? ?No current facility-administered medications on file prior to visit.  ? ? ? ?Allergies: ?Allergies  ?Allergen Reactions  ? Divalproex Sodium Other (See Comments)  ?  sluggish  ? Oxycodone Nausea Only  ? Topamax Other (See Comments)  ?  Memory loss  ? Gabapentin Other (See Comments)  ?  Low mood, intense sadness  ? ? ?Family History: ?Migraine or other headaches in the family:  none ?Aneurysms in a first degree relative:  none ?Brain tumors in the family:  none ?Other neurological illness in the family:   none ? ?Past Medical History: ?Past Medical History:  ?Diagnosis Date  ? Anxiety   ? Bipolar disorder ([place])   ? Cancer [facility])   ? rt arm squamous cell ca  ? History of migraine headaches   ? History of recurrent UTIs   ? Migraines   ? Osteoporosis   ? ? ?Past Surgical History ?Past Surgical History:  ?Procedure Laterality Date  ? COLONOSCOPY    ? ROTATOR CUFF REPAIR Left   ? SKIN CANCER EXCISION    ? TOE SURGERY    ? ? ?Social History: ?Social History  ? ?Tobacco Use  ? Smoking status: Never  ? Smokeless tobacco: Never  ?Vaping Use  ? Vaping Use: Never used  ?Substance Use Topics  ? Alcohol use: Yes  ?  Alcohol/week: 14.0 standard drinks  ?  Types: 14 Glasses of wine per week  ?  Comment: 1 glasses of wine a night  ? Drug use: No  ? ? ?ROS: ?Negative for fevers, chills. Positive for headaches. All other systems reviewed and negative unless stated otherwise in HPI. ? ? ?Physical Exam:  ? ?Vital Signs: ?BP 122/82   Ht '5\' 3"'$  (1.6 m)   Wt 113 lb (51.3 kg)   BMI 20.02 kg/m?  ?GENERAL: well appearing,in no acute distress,alert ?SKIN:  Color, texture, turgor  normal. No rashes or lesions ?HEAD:  Normocephalic/atraumatic. ?CV:  RRR ?RESP: Normal respiratory effort ?MSK: +tenderness to palpation over bilateral  occiput, neck, or shoulders ? ?NEUROLOGICAL: ?Mental Status: Alert, oriented to person, place and time,Follows commands ?Cranial Nerves: PERRL, visual fields intact to confrontation, extraocular movements intact, diminished sensation right V2, no facial droop or ptosis, hearing grossly intact, no dysarthria ?Motor: muscle strength 5/5 both upper and lower extremities ?Reflexes: 2+ throughout ?Sensation: diminished sensation to light touch LUE, otherwise sensation intact to light touch all 4 extremities ?Coordination: Finger-to- nose-finger intact bilaterally ?Gait: normal-based. Bilateral hand tremor present (hx chronic lithium use) ? ? ?IMPRESSION: ?71 year old female with a history of bipolar disorder and anxiety who presents for evaluation of new daily left-sided headaches. Will order MRI brain to assess for structural causes of new side-locked headache in patient >50. She does have significant L>R neck and occiput tenderness. Will refer to neck PT for cervicalgia and occipital neuralgia. Suspect she may also have a component of medication overuse headache in the setting of daily Zomig use. Counseled on limiting rescue medications to 2 days per week to avoid rebound headaches. Will start Emgality for migraine prevention. ? ?PLAN: ?-MRI brain with contrast ?-Prevention: Start Emgality 120 mg monthly (loading dose given in office today) ?-Rescue: Continue Zomig PRN, 2.5 mg tablets sent in to pharmacy at patient request (previously used nasal spray) ?-Counseled on limiting triptan use to 2 days per week to avoid rebound headaches ?-Referral to neck PT for occipital neuralgia and cervicalgia ? ?I spent a total of 64 minutes chart reviewing and counseling the patient. Headache education was done. Discussed treatment options including preventive and acute medications, natural supplements, and physical therapy. Discussed medication overuse headache and to limit use of acute treatments to no more than 2 days/week or 10  days/month. Discussed medication side effects, adverse reactions and drug interactions. Written educational materials and patient instructions outlining all of the above were given. ? ?Follow-up: 4 months ? ?

## 2022-01-18 NOTE — Patient Instructions (Addendum)
MRI brain. Take ativan 30-45 minutes prior to MRI ?Limit Zomig to 2 days per week to avoid rebound headaches ?Referral to physical therapy for the neck ?Start Emgality monthly for headache prevention. First dose is 2 injections, and the next doses are one injection monthly ?

## 2022-01-20 ENCOUNTER — Telehealth: Payer: Self-pay | Admitting: Psychiatry

## 2022-01-20 ENCOUNTER — Encounter: Payer: Self-pay | Admitting: Psychiatry

## 2022-01-20 ENCOUNTER — Telehealth: Payer: Self-pay | Admitting: *Deleted

## 2022-01-20 ENCOUNTER — Other Ambulatory Visit: Payer: Self-pay | Admitting: Psychiatry

## 2022-01-20 MED ORDER — METHYLPREDNISOLONE 4 MG PO TBPK: ORAL_TABLET | ORAL | 0 refills | Status: DC

## 2022-01-20 NOTE — Telephone Encounter (Signed)
Any suggestions>?

## 2022-01-20 NOTE — Telephone Encounter (Signed)
Mcarthur Rossetti Josem Kaufmann: [id number] (exp. 01/20/22 to 02/19/22) order sent to GI, they will reach out to the patient to schedule.  ?

## 2022-01-20 NOTE — Telephone Encounter (Signed)
Emgality PA, key [id number], G43.019, r41.9.  ?Prior Therapies                                 ?Topamax - cognitive changes ?Depakote - drowsiness ?Gabapentin - mood changes ?Nadolol 20 mg daily ?Zomig 5 mg PRN ?Amerge 1 mg PRN ?Imitrex ?ibuprofen ? Immediately approved: ?Case: [id number], Status: Approved, Coverage Starts on: 01/20/2022 12:00:00 AM, Coverage Ends on: 04/20/2022 12:00:00 AM. Questions? Contact [phone]. ?

## 2022-01-20 NOTE — Telephone Encounter (Signed)
I don't have any specific recommendations. I've had good experiences with pretty much everyone I've referred to in the area

## 2022-01-22 DIAGNOSIS — Z5181 Encounter for therapeutic drug level monitoring: Secondary | ICD-10-CM | POA: Diagnosis not present

## 2022-01-22 DIAGNOSIS — R7889 Finding of other specified substances, not normally found in blood: Secondary | ICD-10-CM | POA: Diagnosis not present

## 2022-01-25 ENCOUNTER — Ambulatory Visit
Admission: RE | Admit: 2022-01-25 | Discharge: 2022-01-25 | Disposition: A | Discharge status: Home / Self Care | Payer: Medicare PPO | Source: Ambulatory Visit | Attending: Psychiatry | Admitting: Psychiatry

## 2022-01-25 ENCOUNTER — Other Ambulatory Visit: Payer: Self-pay | Admitting: Psychiatry

## 2022-01-25 DIAGNOSIS — R519 Headache, unspecified: Secondary | ICD-10-CM | POA: Diagnosis not present

## 2022-01-26 ENCOUNTER — Other Ambulatory Visit: Payer: Medicare PPO

## 2022-01-28 NOTE — Therapy (Signed)
?OUTPATIENT PHYSICAL THERAPY EVALUATION ? ? ?Patient Name: Julia Lee ?MRN: [id number] ?DOB:26-Aug-1951, 71 y.o., female ?Today's Date: 01/29/2022 ? ? PT End of Session - 01/29/22 0744   ? ? Visit Number 1   ? Number of Visits 8   ? Date for PT Re-Evaluation 03/26/22   ? Authorization Type [employer] MCR   ? Progress Note Due on Visit 10   ? PT Start Time 0745   ? PT Stop Time 0830   ? PT Time Calculation (min) 45 min   ? Activity Tolerance Patient tolerated treatment well   ? Behavior During Therapy [facility] for tasks assessed/performed   ? ?  ?  ? ?  ? ? ?Past Medical History:  ?Diagnosis Date  ? Anxiety   ? Bipolar disorder ([place])   ? Cancer [facility])   ? rt arm squamous cell ca  ? History of migraine headaches   ? History of recurrent UTIs   ? Migraines   ? Osteoporosis   ? ?Past Surgical History:  ?Procedure Laterality Date  ? COLONOSCOPY    ? ROTATOR CUFF REPAIR Left   ? SKIN CANCER EXCISION    ? TOE SURGERY    ? ?Patient Active Problem List  ? Diagnosis Date Noted  ? Abdominal pain, epigastric 11/13/2021  ? Dysphagia 11/13/2021  ? Loss of weight 11/13/2021  ? Osteoporosis 02/08/2018  ? Tremor of both hands 02/07/2017  ? Bipolar disorder ([place]) 04/01/2011  ? History of migraine headaches 04/01/2011  ? ? ?PCP: Elby Showers, MD ? ?REFERRING PROVIDER: Genia Harold, MD ? ?REFERRING DIAG: Cervicalgia ? ?THERAPY DIAG:  ?Cervicalgia ? ?Muscle weakness (generalized) ? ?ONSET DATE: 4-5 months ? ? ?SUBJECTIVE:           ?SUBJECTIVE STATEMENT: ?Patient a couple places around her neck/head that have been hurting, more of a dull achy pain, and her upper back around the shoulders to mid back have muscular pain. She has also had a lot of trouble with headaches for the past 4-5 months. Pain is mainly located to the left side. She denies any specific injuries that causes the pain. Patient does state she is sensitive to light and noise and these can exacerbate her symptoms.  ? ?PERTINENT HISTORY:  ?Anxiety ? ?PAIN:  ?Are you having  pain? Yes:  ?NPRS scale: 1-2/10 (7-8/10 at worst) ?Pain location: Neck, upper-mid back, left head ?Pain description: Constant, dull achy ?Aggravating factors: Light and loud noises ?Relieving factors: Medication,  ? ?PRECAUTIONS: None ? ?WEIGHT BEARING RESTRICTIONS No ? ?FALLS:  ?Has patient fallen in last 6 months? No ? ?LIVING ENVIRONMENT: ?Lives with: lives with their family ? ?OCCUPATION: patient reports she is still going to meetings, spends most of her day laying down due to anxiety ? ?PLOF: Independent ? ?PATIENT GOALS: Pain and headache relief ? ? ?OBJECTIVE:  ?DIAGNOSTIC FINDINGS:  ?Negative ? ?PATIENT SURVEYS:  ?FOTO 57% functional status ? ?COGNITION: ?Overall cognitive status: Within functional limits for tasks assessed ? ?SENSATION: ?WFL ? ?POSTURE:  ?Rounded shoulder and forward head posture ? ?PALPATION: ?Bilateral suboccipitals, L>R upper trap and periscapular region  ? ?CERVICAL ROM:  ? ?Active ROM A/PROM (deg) ?01/29/2022  ?Flexion 50  ?Extension 50  ?Right lateral flexion 15  ?Left lateral flexion 20  ?Right rotation 65  ?Left rotation 60  ? ?UE ROM: ?  UE AROM grossly WFL ? ?UE MMT: ? ?MMT Right ?01/29/2022 Left ?01/29/2022  ?Shoulder flexion 5 5  ?Shoulder extension 5 5  ?Shoulder abduction 5  5  ?Shoulder internal rotation 5 5  ?Shoulder external rotation 5 5  ?Periscap musculature 4 4  ?   ?CERVICAL SPECIAL TESTS:  ?Radicular testing negative ? ?FUNCTIONAL TESTS:  ?DNF endurance test: 11 seconds ? ? ?TODAY'S TREATMENT:  ?Supine and seated cervical retraction with finger PROM 10 x 5 sec each ? ? ?PATIENT EDUCATION:  ?Education details: Exam findings, POC, HEP, dry needling for future visits ?Person educated: Patient ?Education method: Explanation, Demonstration, Tactile cues, Verbal cues, and Handouts ?Education comprehension: verbalized understanding, returned demonstration, verbal cues required, tactile cues required, and needs further education ? ?HOME EXERCISE PROGRAM: ?Access Code:  [email or web address] ? ? ?ASSESSMENT: ?CLINICAL IMPRESSION: ?Patient is a 71 y.o. female who was seen today for physical therapy evaluation and treatment for chronic neck and upper back pain, and headaches. Overall she exhibits good cervical and thoracic motion and strength, but she does report tenderness with palpation of cervical musculature and postural deficits. She required extensive cueing for cervical retraction technique and her anxiety does seem to play a role in her pain/headaches. ? ? ?OBJECTIVE IMPAIRMENTS decreased activity tolerance, decreased ROM, decreased strength, postural dysfunction, and pain.  ? ?ACTIVITY LIMITATIONS community activity, meal prep, occupation, and shopping.  ? ?PERSONAL FACTORS Past/current experiences, Time since onset of injury/illness/exacerbation, and 1 comorbidity: Anxiety  are also affecting patient's functional outcome.  ? ? ?REHAB POTENTIAL: Good ? ?CLINICAL DECISION MAKING: Stable/uncomplicated ? ?EVALUATION COMPLEXITY: Low ? ? ?GOALS: ?Goals reviewed with patient? Yes ? ?SHORT TERM GOALS: Target date: 02/26/2022 ? ?Patient will be I with initial HEP in order to progress with therapy. ?Baseline: HEP provided at evaluation ?Goal status: INITIAL ? ?2.  PT will review FOTO with patient by 3rd visit in order to understand expected progress and outcome with therapy. ?Baseline: FOTO assessed at eval ?Goal status: INITIAL ? ?3.  Patient will report 25% improvement in severity and frequency of headaches and/or neck pain to reduce functional limitations ?Baseline: patient reports pain up to 7-8/10 ?Goal status: INITIAL ? ?LONG TERM GOALS: Target date: 03/26/2022 ? ?Patient will be I with final HEP to maintain progress from PT. ?Baseline: HEP provided at evaluation ?Goal status: INITIAL ? ?2.  Patient will report >/= 68% status on FOTO to indicate improved functional ability. ?Baseline: 57% functional status ?Goal status: INITIAL ? ?3.  Patient will exhibit improved cervical rotation >/= 10 deg  to indicate reduced muscle tension and improve functional mobility ?Baseline: left rotation 60 deg ?Goal status: INITIAL ? ?4.  Patient will demonstrate DNF endurance >/= 20 seconds to improve postural control and reduce pain with sitting extended periods ?Baseline: 11 seconds ?Goal status: INITIAL ? ? ?PLAN: ?PT FREQUENCY: 1-2x/week ? ?PT DURATION: 8 weeks ? ?PLANNED INTERVENTIONS: Therapeutic exercises, Therapeutic activity, Neuromuscular re-education, Balance training, Gait training, Patient/Family education, Joint manipulation, Joint mobilization, Aquatic Therapy, Dry Needling, Electrical stimulation, Spinal manipulation, Spinal mobilization, Cryotherapy, Moist heat, Taping, and Manual therapy ? ?PLAN FOR NEXT SESSION: Review HEP and progress PRN, manual/dry needling for suboccipital/upper trap/periscapular region, DNF endurance and postural strengthening, pec stretch, instructed on SMFR using tennis ball for home use ? ? ?Hilda Blades, PT, DPT, LAT, ATC ?01/29/22  1:45 PM ?Phone: [phone] ?Fax: [phone] ? ? ? ? ? ? ?

## 2022-01-29 ENCOUNTER — Other Ambulatory Visit: Payer: Self-pay

## 2022-01-29 ENCOUNTER — Encounter: Payer: Self-pay | Admitting: Physical Therapy

## 2022-01-29 ENCOUNTER — Ambulatory Visit: Payer: Medicare PPO | Attending: Psychiatry | Admitting: Physical Therapy

## 2022-01-29 DIAGNOSIS — M542 Cervicalgia: Secondary | ICD-10-CM | POA: Diagnosis not present

## 2022-01-29 DIAGNOSIS — M6281 Muscle weakness (generalized): Secondary | ICD-10-CM | POA: Diagnosis not present

## 2022-01-29 NOTE — Patient Instructions (Signed)
Access Code: [email or web address] ?URL: [email or web address] ?Date: 01/29/2022 ?Prepared by: Hilda Blades ? ?Exercises ?- Supine Passive Cervical Retraction  - 3-4 x daily - 10 reps - 5 seconds hold ?- Seated Passive Cervical Retraction  - 3-4 x daily - 10 reps - 5 seconds hold ?

## 2022-02-01 NOTE — Therapy (Signed)
?OUTPATIENT PHYSICAL THERAPY TREATMENT NOTE ? ? ?Patient Name: Julia Lee ?MRN: [id number] ?DOB:1951/09/03, 71 y.o., female ?Today's Date: 02/02/2022 ? ?PCP: Elby Showers, MD ?REFERRING PROVIDER: Elby Showers, MD ? ?END OF SESSION:  ? PT End of Session - 02/02/22 0959   ? ? Visit Number 2   ? Number of Visits 8   ? Date for PT Re-Evaluation 03/26/22   ? Authorization Type [employer] MCR   ? Progress Note Due on Visit 10   ? PT Start Time 1000   ? PT Stop Time 4098   ? PT Time Calculation (min) 40 min   ? Activity Tolerance Patient tolerated treatment well   ? Behavior During Therapy [facility] for tasks assessed/performed   ? ?  ?  ? ?  ? ? ?Past Medical History:  ?Diagnosis Date  ? Anxiety   ? Bipolar disorder ([place])   ? Cancer [facility])   ? rt arm squamous cell ca  ? History of migraine headaches   ? History of recurrent UTIs   ? Migraines   ? Osteoporosis   ? ?Past Surgical History:  ?Procedure Laterality Date  ? COLONOSCOPY    ? ROTATOR CUFF REPAIR Left   ? SKIN CANCER EXCISION    ? TOE SURGERY    ? ?Patient Active Problem List  ? Diagnosis Date Noted  ? Abdominal pain, epigastric 11/13/2021  ? Dysphagia 11/13/2021  ? Loss of weight 11/13/2021  ? Osteoporosis 02/08/2018  ? Tremor of both hands 02/07/2017  ? Bipolar disorder ([place]) 04/01/2011  ? History of migraine headaches 04/01/2011  ? ? ?REFERRING DIAG:  ?Cervicalgia ? ?THERAPY DIAG:  ?Cervicalgia ? ?Muscle weakness (generalized) ? ?PERTINENT HISTORY:  ?Anxiety ? ?PRECAUTIONS:  ?None ? ?SUBJECTIVE:  ?Pt presents to PT with continued reports of neck and mid-upper back stiffness. Has been compliant with her HEP with no adverse effect. Pt is ready to begin PT at this time.  ? ?PAIN:  ?Are you having pain?  ?No: NPRS scale: 0/10 (7-8/10 at worst) ?Pain location: Neck, upper-mid back, left head ?Pain description: Constant, dull achy ?Aggravating factors: Light and loud noises ?Relieving factors: Medication ? ? ?OBJECTIVE: (objective measures completed at initial  evaluation unless otherwise dated) ? ?DIAGNOSTIC FINDINGS:  ?Negative ?  ?PATIENT SURVEYS:  ?FOTO 57% functional status ?  ?COGNITION: ?Overall cognitive status: Within functional limits for tasks assessed ?  ?SENSATION: ?WFL ?  ?POSTURE:  ?Rounded shoulder and forward head posture ?  ?PALPATION: ?Bilateral suboccipitals, L>R upper trap and periscapular region          ?  ?CERVICAL ROM:  ?  ?Active ROM A/PROM (deg) ?01/29/2022  ?Flexion 50  ?Extension 50  ?Right lateral flexion 15  ?Left lateral flexion 20  ?Right rotation 65  ?Left rotation 60  ?  ?UE ROM: ?                      UE AROM grossly WFL ?  ?UE MMT: ?  ?MMT Right ?01/29/2022 Left ?01/29/2022  ?Shoulder flexion 5 5  ?Shoulder extension 5 5  ?Shoulder abduction 5 5  ?Shoulder internal rotation 5 5  ?Shoulder external rotation 5 5  ?Periscap musculature 4 4  ?           ?CERVICAL SPECIAL TESTS:  ?Radicular testing negative ?  ?FUNCTIONAL TESTS:  ?DNF endurance test: 11 seconds ?  ?  ?TODAY'S TREATMENT:  ?[place] Adult PT Treatment:  DATE: 02/02/2022 ?Therapeutic Exercise: ?UBE lvl 1.0 x 4 min fwd while taking subjective ?Row 2x10 13#  ?Seated horizontal abd 2x10 RTB ?Levator stretch x 30" each ?S/L open book 2x10 each ?Supine chin tuck x 10  ?Manual Therapy: ?Positional release L upper trap ?Suboccipital release ? Trigger Point Dry Needling Treatment: ?Pre-treatment instruction: Patient instructed on dry needling rationale, procedures, and possible side effects including pain during treatment (achy,cramping feeling), bruising, drop of blood, lightheadedness, nausea, sweating. ?Patient Consent Given: Yes ?Education handout provided: No ?Muscles treated: L upper trap  ?Needle size and number: .30x53m x 2 ?Electrical stimulation performed: No ?Parameters: N/A ?Treatment response/outcome: Twitch response elicited and Palpable decrease in muscle tension ?Post-treatment instructions: Patient instructed to expect possible  mild to moderate muscle soreness later today and/or tomorrow. Patient instructed in methods to reduce muscle soreness and to continue prescribed HEP. If patient was dry needled over the lung field, patient was instructed on signs and symptoms of pneumothorax and, however unlikely, to see immediate medical attention should they occur. Patient was also educated on signs and symptoms of infection and to seek medical attention should they occur. Patient verbalized understanding of these instructions and education.  ?  ?PATIENT EDUCATION:  ?Education details: Exam findings, POC, HEP, dry needling for future visits ?Person educated: Patient ?Education method: Explanation, Demonstration, Tactile cues, Verbal cues, and Handouts ?Education comprehension: verbalized understanding, returned demonstration, verbal cues required, tactile cues required, and needs further education ?  ?HOME EXERCISE PROGRAM: ?Access Code: [email or web address] ?  ?  ?ASSESSMENT: ?CLINICAL IMPRESSION: ?Patient was able to complete all prescribed exercises with no adverse effect. Therapy today focused on improving DNF endurance and periscapular strength. She responded well to TPDN and manual therapy interventions. HEP updated for continued work at home. Will continue to progress as tolerated per POC.  ?  ?  ?OBJECTIVE IMPAIRMENTS decreased activity tolerance, decreased ROM, decreased strength, postural dysfunction, and pain.  ?  ?ACTIVITY LIMITATIONS community activity, meal prep, occupation, and shopping.  ?  ?PERSONAL FACTORS Past/current experiences, Time since onset of injury/illness/exacerbation, and 1 comorbidity: Anxiety  are also affecting patient's functional outcome.  ?  ?  ?REHAB POTENTIAL: Good ?  ?CLINICAL DECISION MAKING: Stable/uncomplicated ?  ?EVALUATION COMPLEXITY: Low ?  ?  ?GOALS: ?Goals reviewed with patient? Yes ?  ?SHORT TERM GOALS: Target date: 02/26/2022 ?  ?Patient will be I with initial HEP in order to progress with therapy. ?Baseline:  HEP provided at evaluation ?Goal status: INITIAL ?  ?2.  PT will review FOTO with patient by 3rd visit in order to understand expected progress and outcome with therapy. ?Baseline: FOTO assessed at eval ?Goal status: INITIAL ?  ?3.  Patient will report 25% improvement in severity and frequency of headaches and/or neck pain to reduce functional limitations ?Baseline: patient reports pain up to 7-8/10 ?Goal status: INITIAL ?  ?LONG TERM GOALS: Target date: 03/26/2022 ?  ?Patient will be I with final HEP to maintain progress from PT. ?Baseline: HEP provided at evaluation ?Goal status: INITIAL ?  ?2.  Patient will report >/= 68% status on FOTO to indicate improved functional ability. ?Baseline: 57% functional status ?Goal status: INITIAL ?  ?3.  Patient will exhibit improved cervical rotation >/= 10 deg to indicate reduced muscle tension and improve functional mobility ?Baseline: left rotation 60 deg ?Goal status: INITIAL ?  ?4.  Patient will demonstrate DNF endurance >/= 20 seconds to improve postural control and reduce pain with sitting extended periods ?Baseline: 11 seconds ?Goal status: INITIAL ?  ?  ?  PLAN: ?PT FREQUENCY: 1-2x/week ?  ?PT DURATION: 8 weeks ?  ?PLANNED INTERVENTIONS: Therapeutic exercises, Therapeutic activity, Neuromuscular re-education, Balance training, Gait training, Patient/Family education, Joint manipulation, Joint mobilization, Aquatic Therapy, Dry Needling, Electrical stimulation, Spinal manipulation, Spinal mobilization, Cryotherapy, Moist heat, Taping, and Manual therapy ?  ?PLAN FOR NEXT SESSION: Review HEP and progress PRN, manual/dry needling for suboccipital/upper trap/periscapular region, DNF endurance and postural strengthening, pec stretch, instructed on SMFR using tennis ball for home use ? ? ? ?Ward Chatters, PT ?02/02/2022, 10:44 AM ? ?   ?

## 2022-02-02 ENCOUNTER — Ambulatory Visit: Payer: Medicare PPO

## 2022-02-02 DIAGNOSIS — M6281 Muscle weakness (generalized): Secondary | ICD-10-CM | POA: Diagnosis not present

## 2022-02-02 DIAGNOSIS — M542 Cervicalgia: Secondary | ICD-10-CM

## 2022-02-04 ENCOUNTER — Ambulatory Visit: Payer: Medicare PPO

## 2022-02-04 DIAGNOSIS — M542 Cervicalgia: Secondary | ICD-10-CM | POA: Diagnosis not present

## 2022-02-04 DIAGNOSIS — M6281 Muscle weakness (generalized): Secondary | ICD-10-CM | POA: Diagnosis not present

## 2022-02-04 NOTE — Therapy (Signed)
?OUTPATIENT PHYSICAL THERAPY TREATMENT NOTE ? ? ?Patient Name: Julia Lee ?MRN: [id number] ?DOB:[birth year], 71 y.o., female ?Today's Date: 02/04/2022 ? ?PCP: Elby Showers, MD ?REFERRING PROVIDER: Genia Harold, MD ? ?END OF SESSION:  ? PT End of Session - 02/04/22 0910   ? ? Visit Number 3   ? Number of Visits 8   ? Date for PT Re-Evaluation 03/26/22   ? Authorization Type [employer] MCR   ? Progress Note Due on Visit 10   ? PT Start Time 7915   ? PT Stop Time 0954   ? PT Time Calculation (min) 39 min   ? Activity Tolerance Patient tolerated treatment well   ? Behavior During Therapy [facility] for tasks assessed/performed   ? ?  ?  ? ?  ? ? ? ?Past Medical History:  ?Diagnosis Date  ? Anxiety   ? Bipolar disorder ([place])   ? Cancer [facility])   ? rt arm squamous cell ca  ? History of migraine headaches   ? History of recurrent UTIs   ? Migraines   ? Osteoporosis   ? ?Past Surgical History:  ?Procedure Laterality Date  ? COLONOSCOPY    ? ROTATOR CUFF REPAIR Left   ? SKIN CANCER EXCISION    ? TOE SURGERY    ? ?Patient Active Problem List  ? Diagnosis Date Noted  ? Abdominal pain, epigastric 11/13/2021  ? Dysphagia 11/13/2021  ? Loss of weight 11/13/2021  ? Osteoporosis 02/08/2018  ? Tremor of both hands 02/07/2017  ? Bipolar disorder ([place]) 04/01/2011  ? History of migraine headaches 04/01/2011  ? ? ?REFERRING DIAG:  ?Cervicalgia ? ?THERAPY DIAG:  ?Cervicalgia ? ?Muscle weakness (generalized) ? ?PERTINENT HISTORY:  ?Anxiety ? ?PRECAUTIONS:  ?None ? ?SUBJECTIVE:  ?Pt presents to PT with no current neck pain, does some some stiffness in L upper trap after previous dry needling. Has been compliant with her HEP with no adverse effect. Pt is ready to begin PT at this time.  ? ?PAIN:  ?Are you having pain?  ?No: NPRS scale: 0/10 (7-8/10 at worst) ?Pain location: Neck, upper-mid back, left head ?Pain description: Constant, dull achy ?Aggravating factors: Light and loud noises ?Relieving factors: Medication ? ? ?OBJECTIVE: (objective  measures completed at initial evaluation unless otherwise dated) ? ?DIAGNOSTIC FINDINGS:  ?Negative ?  ?PATIENT SURVEYS:  ?FOTO 57% functional status ?  ?COGNITION: ?Overall cognitive status: Within functional limits for tasks assessed ?  ?SENSATION: ?WFL ?  ?POSTURE:  ?Rounded shoulder and forward head posture ?  ?PALPATION: ?Bilateral suboccipitals, L>R upper trap and periscapular region          ?  ?CERVICAL ROM:  ?  ?Active ROM A/PROM (deg) ?01/29/2022  ?Flexion 50  ?Extension 50  ?Right lateral flexion 15  ?Left lateral flexion 20  ?Right rotation 65  ?Left rotation 60  ?  ?UE ROM: ?                      UE AROM grossly WFL ?  ?UE MMT: ?  ?MMT Right ?01/29/2022 Left ?01/29/2022  ?Shoulder flexion 5 5  ?Shoulder extension 5 5  ?Shoulder abduction 5 5  ?Shoulder internal rotation 5 5  ?Shoulder external rotation 5 5  ?Periscap musculature 4 4  ?           ?CERVICAL SPECIAL TESTS:  ?Radicular testing negative ?  ?FUNCTIONAL TESTS:  ?DNF endurance test: 11 seconds ?  ?  ?TODAY'S TREATMENT:  ?[place] Adult PT  Treatment:                                                DATE: 02/04/2022 ?Therapeutic Exercise: ?UBE lvl 1.0 x 4 min fwd while taking subjective ?Row 3x10 13#  ?Seated horizontal abd 2x10 GTB ?Supine serratus punch 2x10 2# each ?Levator stretch x 30" each ?S/L open book 2x10 each ?Supine chin tuck 2x10  ?Manual Therapy: ?Positional release L upper trap ?Suboccipital release ?Trigger point release L upper trap ? ?[facility] Adult PT Treatment:                                                DATE: 02/02/2022 ?Therapeutic Exercise: ?UBE lvl 1.0 x 4 min fwd while taking subjective ?Row 2x10 13#  ?Seated horizontal abd 2x10 RTB ?Levator stretch x 30" each ?S/L open book 2x10 each ?Supine chin tuck x 10  ?Manual Therapy: ?Positional release L upper trap ?Suboccipital release ? Trigger Point Dry Needling Treatment: ?Pre-treatment instruction: Patient instructed on dry needling rationale, procedures, and possible side effects  including pain during treatment (achy,cramping feeling), bruising, drop of blood, lightheadedness, nausea, sweating. ?Patient Consent Given: Yes ?Education handout provided: No ?Muscles treated: L upper trap  ?Needle size and number: .30x69m x 2 ?Electrical stimulation performed: No ?Parameters: N/A ?Treatment response/outcome: Twitch response elicited and Palpable decrease in muscle tension ?Post-treatment instructions: Patient instructed to expect possible mild to moderate muscle soreness later today and/or tomorrow. Patient instructed in methods to reduce muscle soreness and to continue prescribed HEP. If patient was dry needled over the lung field, patient was instructed on signs and symptoms of pneumothorax and, however unlikely, to see immediate medical attention should they occur. Patient was also educated on signs and symptoms of infection and to seek medical attention should they occur. Patient verbalized understanding of these instructions and education.  ?  ?PATIENT EDUCATION:  ?Education details: Exam findings, POC, HEP, dry needling for future visits ?Person educated: Patient ?Education method: Explanation, Demonstration, Tactile cues, Verbal cues, and Handouts ?Education comprehension: verbalized understanding, returned demonstration, verbal cues required, tactile cues required, and needs further education ?  ?HOME EXERCISE PROGRAM: ?Access Code: [email or web address] ?  ?  ?ASSESSMENT: ?CLINICAL IMPRESSION: ?Pt was able to complete all prescribed exercises with no adverse effect or increase in pain. Therapy today focused on improving DNF and periscapular strength as well as manual therapy for decreasing pain. She continues to progress well with skilled PT and will continue to be seen and progressed as able.  ?  ?  ?OBJECTIVE IMPAIRMENTS decreased activity tolerance, decreased ROM, decreased strength, postural dysfunction, and pain.  ?  ?ACTIVITY LIMITATIONS community activity, meal prep, occupation, and shopping.   ?  ?PERSONAL FACTORS Past/current experiences, Time since onset of injury/illness/exacerbation, and 1 comorbidity: Anxiety  are also affecting patient's functional outcome.  ?  ?GOALS: ?Goals reviewed with patient? Yes ?  ?SHORT TERM GOALS: Target date: 02/26/2022 ?  ?Patient will be I with initial HEP in order to progress with therapy. ?Baseline: HEP provided at evaluation ?Goal status: INITIAL ?  ?2.  PT will review FOTO with patient by 3rd visit in order to understand expected progress and outcome with therapy. ?Baseline: FOTO assessed at eval ?Goal status: INITIAL ?  ?  3.  Patient will report 25% improvement in severity and frequency of headaches and/or neck pain to reduce functional limitations ?Baseline: patient reports pain up to 7-8/10 ?Goal status: INITIAL ?  ?LONG TERM GOALS: Target date: 03/26/2022 ?  ?Patient will be I with final HEP to maintain progress from PT. ?Baseline: HEP provided at evaluation ?Goal status: INITIAL ?  ?2.  Patient will report >/= 68% status on FOTO to indicate improved functional ability. ?Baseline: 57% functional status ?Goal status: INITIAL ?  ?3.  Patient will exhibit improved cervical rotation >/= 10 deg to indicate reduced muscle tension and improve functional mobility ?Baseline: left rotation 60 deg ?Goal status: INITIAL ?  ?4.  Patient will demonstrate DNF endurance >/= 20 seconds to improve postural control and reduce pain with sitting extended periods ?Baseline: 11 seconds ?Goal status: INITIAL ?  ?  ?PLAN: ?PT FREQUENCY: 1-2x/week ?  ?PT DURATION: 8 weeks ?  ?PLANNED INTERVENTIONS: Therapeutic exercises, Therapeutic activity, Neuromuscular re-education, Balance training, Gait training, Patient/Family education, Joint manipulation, Joint mobilization, Aquatic Therapy, Dry Needling, Electrical stimulation, Spinal manipulation, Spinal mobilization, Cryotherapy, Moist heat, Taping, and Manual therapy ?  ?PLAN FOR NEXT SESSION: Review HEP and progress PRN, manual/dry needling  for suboccipital/upper trap/periscapular region, DNF endurance and postural strengthening, pec stretch, instructed on SMFR using tennis ball for home use ? ? ? ?Ward Chatters, PT ?02/04/2022, 9:55 AM ? ?

## 2022-02-05 ENCOUNTER — Encounter: Payer: Medicare PPO | Admitting: Physical Therapy

## 2022-02-09 ENCOUNTER — Ambulatory Visit: Payer: Medicare PPO

## 2022-02-10 DIAGNOSIS — M810 Age-related osteoporosis without current pathological fracture: Secondary | ICD-10-CM | POA: Diagnosis not present

## 2022-02-11 NOTE — Therapy (Signed)
?OUTPATIENT PHYSICAL THERAPY TREATMENT NOTE ? ? ?Patient Name: Julia Lee ?MRN: [id number] ?DOB:05-02-1951, 71 y.o., female ?Today's Date: 02/13/2022 ? ?PCP: Elby Showers, MD ?REFERRING PROVIDER: Genia Harold, MD ? ?END OF SESSION:  ? PT End of Session - 02/13/22 1825   ? ? Visit Number 4   ? Number of Visits 8   ? Date for PT Re-Evaluation 03/26/22   ? Authorization Type [employer] MCR   ? Progress Note Due on Visit 10   ? PT Start Time [phone]   ? PT Stop Time [phone]   ? PT Time Calculation (min) 42 min   ? Activity Tolerance Patient tolerated treatment well   ? Behavior During Therapy [facility] for tasks assessed/performed   ? ?  ?  ? ?  ? ? ? ? ?Past Medical History:  ?Diagnosis Date  ? Anxiety   ? Bipolar disorder ([place])   ? Cancer [facility])   ? rt arm squamous cell ca  ? History of migraine headaches   ? History of recurrent UTIs   ? Migraines   ? Osteoporosis   ? ?Past Surgical History:  ?Procedure Laterality Date  ? COLONOSCOPY    ? ROTATOR CUFF REPAIR Left   ? SKIN CANCER EXCISION    ? TOE SURGERY    ? ?Patient Active Problem List  ? Diagnosis Date Noted  ? Abdominal pain, epigastric 11/13/2021  ? Dysphagia 11/13/2021  ? Loss of weight 11/13/2021  ? Osteoporosis 02/08/2018  ? Tremor of both hands 02/07/2017  ? Bipolar disorder ([place]) 04/01/2011  ? History of migraine headaches 04/01/2011  ? ? ?REFERRING DIAG:  ?Cervicalgia ? ?THERAPY DIAG:  ?Cervicalgia ? ?Muscle weakness (generalized) ? ?PERTINENT HISTORY:  ?Anxiety ? ?PRECAUTIONS:  ?None ? ?SUBJECTIVE:  ?Pt reports L upper trap and upper back discomfort. Pt reports she has been consistent with completing her HEP. ? ?PAIN:  ?Are you having pain?  ?No: NPRS scale: 3/10 (5/10 at worst) ?Pain location: Neck, upper-mid back, left head ?Pain description: Constant, dull achy ?Aggravating factors: Light and loud noises ?Relieving factors: Medication ? ? ?OBJECTIVE: (objective measures completed at initial evaluation unless otherwise dated) ? ?DIAGNOSTIC FINDINGS:   ?Negative ?  ?PATIENT SURVEYS:  ?FOTO 57% functional status ?  ?COGNITION: ?Overall cognitive status: Within functional limits for tasks assessed ?  ?SENSATION: ?WFL ?  ?POSTURE:  ?Rounded shoulder and forward head posture ?  ?PALPATION: ?Bilateral suboccipitals, L>R upper trap and periscapular region          ?  ?CERVICAL ROM:  ?  ?Active ROM A/PROM (deg) ?01/29/2022  ?Flexion 50  ?Extension 50  ?Right lateral flexion 15  ?Left lateral flexion 20  ?Right rotation 65  ?Left rotation 60  ?  ?UE ROM: ?                      UE AROM grossly WFL ?  ?UE MMT: ?  ?MMT Right ?01/29/2022 Left ?01/29/2022  ?Shoulder flexion 5 5  ?Shoulder extension 5 5  ?Shoulder abduction 5 5  ?Shoulder internal rotation 5 5  ?Shoulder external rotation 5 5  ?Periscap musculature 4 4  ?           ?CERVICAL SPECIAL TESTS:  ?Radicular testing negative ?  ?FUNCTIONAL TESTS:  ?DNF endurance test: 11 seconds ?  ?  ?TODAY'S TREATMENT ?[facility] Adult PT Treatment:  DATE: 5/02/12/22 ?Therapeutic Exercise: ?UBE lvl 1.0 x 4 min, 2 mins each direction ?Manual Therapy: ?STM and MTPR to tight taut muscle and TrPs of the upper and mid back    ?Self Care: ?Instruction in the use of a soft foam roller for back massage and to promote thoracic extension with pt completing in standing with wall and chair. ?Instruction in the use of tennis ball and thercane for upper shoulder and upper/mid back TP massage  with pt using both to to tight and tender areas. ? ? ?[facility] Adult PT Treatment:                                                DATE: 02/04/2022 ?Therapeutic Exercise: ?UBE lvl 1.0 x 4 min, 2 mins each direction ?Row 3x10 13#  ?Seated horizontal abd 2x10 GTB ?Supine serratus punch 2x10 2# each ?Levator stretch x 30" each ?S/L open book 2x10 each ?Supine chin tuck 2x10  ?Manual Therapy: ?Positional release L upper trap ?Suboccipital release ?Trigger point release L upper trap ? ?[facility] Adult PT Treatment:                                                 DATE: 02/02/2022 ?Therapeutic Exercise: ?UBE lvl 1.0 x 4 min fwd while taking subjective ?Row 2x10 13#  ?Seated horizontal abd 2x10 RTB ?Levator stretch x 30" each ?S/L open book 2x10 each ?Supine chin tuck x 10  ?Manual Therapy: ?Positional release L upper trap ?Suboccipital release ? Trigger Point Dry Needling Treatment: ?Pre-treatment instruction: Patient instructed on dry needling rationale, procedures, and possible side effects including pain during treatment (achy,cramping feeling), bruising, drop of blood, lightheadedness, nausea, sweating. ?Patient Consent Given: Yes ?Education handout provided: No ?Muscles treated: L upper trap  ?Needle size and number: .30x46m x 2 ?Electrical stimulation performed: No ?Parameters: N/A ?Treatment response/outcome: Twitch response elicited and Palpable decrease in muscle tension ?Post-treatment instructions: Patient instructed to expect possible mild to moderate muscle soreness later today and/or tomorrow. Patient instructed in methods to reduce muscle soreness and to continue prescribed HEP. If patient was dry needled over the lung field, patient was instructed on signs and symptoms of pneumothorax and, however unlikely, to see immediate medical attention should they occur. Patient was also educated on signs and symptoms of infection and to seek medical attention should they occur. Patient verbalized understanding of these instructions and education.  ?  ?PATIENT EDUCATION:  ?Education details: Exam findings, POC, HEP, dry needling for future visits ?Person educated: Patient ?Education method: Explanation, Demonstration, Tactile cues, Verbal cues, and Handouts ?Education comprehension: verbalized understanding, returned demonstration, verbal cues required, tactile cues required, and needs further education ?  ?HOME EXERCISE PROGRAM: ?Access Code: [email or web address] ?  ?  ?ASSESSMENT: ?CLINICAL IMPRESSION: ?Manual therapy was provided to the taut muscle and TPs  of the upper and mid back Pt was then instructed in techniques for mobility of the thoracic spine using a soft foam roller and for TrP massage utilizing a tennis ball and theracane. Pt tolerated the session without adverse effects. Pt will continue to benefit from from skilled PT to address impairments to optimize neck function with less pain. ?  ?OBJECTIVE IMPAIRMENTS decreased activity tolerance, decreased ROM, decreased strength, postural  dysfunction, and pain.  ?  ?ACTIVITY LIMITATIONS community activity, meal prep, occupation, and shopping.  ?  ?PERSONAL FACTORS Past/current experiences, Time since onset of injury/illness/exacerbation, and 1 comorbidity: Anxiety  are also affecting patient's functional outcome.  ?  ?GOALS: ?Goals reviewed with patient? Yes ?  ?SHORT TERM GOALS: Target date: 02/26/2022 ?  ?Patient will be I with initial HEP in order to progress with therapy. ?Baseline: HEP provided at evaluation ?Goal status: INITIAL ?  ?2.  PT will review FOTO with patient by 3rd visit in order to understand expected progress and outcome with therapy. ?Baseline: FOTO assessed at eval ?Goal status: INITIAL ?  ?3.  Patient will report 25% improvement in severity and frequency of headaches and/or neck pain to reduce functional limitations ?Baseline: patient reports pain up to 7-8/10 ?Goal status: INITIAL ?  ?LONG TERM GOALS: Target date: 03/26/2022 ?  ?Patient will be I with final HEP to maintain progress from PT. ?Baseline: HEP provided at evaluation ?Goal status: INITIAL ?  ?2.  Patient will report >/= 68% status on FOTO to indicate improved functional ability. ?Baseline: 57% functional status ?Goal status: INITIAL ?  ?3.  Patient will exhibit improved cervical rotation >/= 10 deg to indicate reduced muscle tension and improve functional mobility ?Baseline: left rotation 60 deg ?Goal status: INITIAL ?  ?4.  Patient will demonstrate DNF endurance >/= 20 seconds to improve postural control and reduce pain with  sitting extended periods ?Baseline: 11 seconds ?Goal status: INITIAL ?  ?  ?PLAN: ?PT FREQUENCY: 1-2x/week ?  ?PT DURATION: 8 weeks ?  ?PLANNED INTERVENTIONS: Therapeutic exercises, Therapeutic activity, Neuromuscul

## 2022-02-12 ENCOUNTER — Ambulatory Visit: Payer: Medicare PPO | Attending: Psychiatry

## 2022-02-12 DIAGNOSIS — M6281 Muscle weakness (generalized): Secondary | ICD-10-CM | POA: Diagnosis not present

## 2022-02-12 DIAGNOSIS — M542 Cervicalgia: Secondary | ICD-10-CM | POA: Insufficient documentation

## 2022-02-16 NOTE — Therapy (Incomplete)
?OUTPATIENT PHYSICAL THERAPY TREATMENT NOTE ? ? ?Patient Name: Julia Lee ?MRN: [id number] ?DOB:[birth year], 71 y.o., female ?Today's Date: 02/16/2022 ? ?PCP: Elby Showers, MD ?REFERRING PROVIDER: Genia Harold, MD ? ?END OF SESSION:  ? ? ? ? ? ?Past Medical History:  ?Diagnosis Date  ? Anxiety   ? Bipolar disorder ([place])   ? Cancer [facility])   ? rt arm squamous cell ca  ? History of migraine headaches   ? History of recurrent UTIs   ? Migraines   ? Osteoporosis   ? ?Past Surgical History:  ?Procedure Laterality Date  ? COLONOSCOPY    ? ROTATOR CUFF REPAIR Left   ? SKIN CANCER EXCISION    ? TOE SURGERY    ? ?Patient Active Problem List  ? Diagnosis Date Noted  ? Abdominal pain, epigastric 11/13/2021  ? Dysphagia 11/13/2021  ? Loss of weight 11/13/2021  ? Osteoporosis 02/08/2018  ? Tremor of both hands 02/07/2017  ? Bipolar disorder ([place]) 04/01/2011  ? History of migraine headaches 04/01/2011  ? ? ?REFERRING DIAG:  ?Cervicalgia ? ?THERAPY DIAG:  ?No diagnosis found. ? ?PERTINENT HISTORY:  ?Anxiety ? ?PRECAUTIONS:  ?None ? ?SUBJECTIVE:  ?Pt reports L upper trap and upper back discomfort. Pt reports she has been consistent with completing her HEP. ? ?PAIN:  ?Are you having pain?  ?No: NPRS scale: 3/10 (5/10 at worst) ?Pain location: Neck, upper-mid back, left head ?Pain description: Constant, dull achy ?Aggravating factors: Light and loud noises ?Relieving factors: Medication ? ? ?OBJECTIVE: (objective measures completed at initial evaluation unless otherwise dated) ? ?DIAGNOSTIC FINDINGS:  ?Negative ?  ?PATIENT SURVEYS:  ?FOTO 57% functional status ?  ?COGNITION: ?Overall cognitive status: Within functional limits for tasks assessed ?  ?SENSATION: ?WFL ?  ?POSTURE:  ?Rounded shoulder and forward head posture ?  ?PALPATION: ?Bilateral suboccipitals, L>R upper trap and periscapular region          ?  ?CERVICAL ROM:  ?  ?Active ROM A/PROM (deg) ?01/29/2022  ?Flexion 50  ?Extension 50  ?Right lateral flexion 15  ?Left  lateral flexion 20  ?Right rotation 65  ?Left rotation 60  ?  ?UE ROM: ?                      UE AROM grossly WFL ?  ?UE MMT: ?  ?MMT Right ?01/29/2022 Left ?01/29/2022  ?Shoulder flexion 5 5  ?Shoulder extension 5 5  ?Shoulder abduction 5 5  ?Shoulder internal rotation 5 5  ?Shoulder external rotation 5 5  ?Periscap musculature 4 4  ?           ?CERVICAL SPECIAL TESTS:  ?Radicular testing negative ?  ?FUNCTIONAL TESTS:  ?DNF endurance test: 11 seconds ?  ?  ?TODAY'S TREATMENT ?[facility] Adult PT Treatment:                                                DATE: 02/18/22 ?Therapeutic Exercise: ?UBE lvl 1.0 x 4 min, 2 mins each direction ?Row 3x10 13#  ?Seated horizontal abd 2x10 GTB ?Supine serratus punch 2x10 2# each ?Levator stretch x 30" each ?S/L open book 2x10 each ?Supine chin tuck 2x10  ?Manual Therapy: ?Positional release L upper trap ?Suboccipital release ?Trigger point release L upper trap. ? ? ?[facility] Adult PT Treatment:  DATE: 5/02/12/22 ?Therapeutic Exercise: ?UBE lvl 1.0 x 4 min, 2 mins each direction ?Manual Therapy: ?STM and MTPR to tight taut muscle and TrPs of the upper and mid back    ?Self Care: ?Instruction in the use of a soft foam roller for back massage and to promote thoracic extension with pt completing in standing with wall and chair. ?Instruction in the use of tennis ball and thercane for upper shoulder and upper/mid back TP massage  with pt using both to to tight and tender areas. ? ?[facility] Adult PT Treatment:                                                DATE: 02/04/2022 ?Therapeutic Exercise: ?UBE lvl 1.0 x 4 min, 2 mins each direction ?Row 3x10 13#  ?Seated horizontal abd 2x10 GTB ?Supine serratus punch 2x10 2# each ?Levator stretch x 30" each ?S/L open book 2x10 each ?Supine chin tuck 2x10  ?Manual Therapy: ?Positional release L upper trap ?Suboccipital release ?Trigger point release L upper trap ? ?[facility] Adult PT Treatment:                                                 DATE: 02/02/2022 ?Therapeutic Exercise: ?UBE lvl 1.0 x 4 min fwd while taking subjective ?Row 2x10 13#  ?Seated horizontal abd 2x10 RTB ?Levator stretch x 30" each ?S/L open book 2x10 each ?Supine chin tuck x 10  ?Manual Therapy: ?Positional release L upper trap ?Suboccipital release ? Trigger Point Dry Needling Treatment: ?Pre-treatment instruction: Patient instructed on dry needling rationale, procedures, and possible side effects including pain during treatment (achy,cramping feeling), bruising, drop of blood, lightheadedness, nausea, sweating. ?Patient Consent Given: Yes ?Education handout provided: No ?Muscles treated: L upper trap  ?Needle size and number: .30x40m x 2 ?Electrical stimulation performed: No ?Parameters: N/A ?Treatment response/outcome: Twitch response elicited and Palpable decrease in muscle tension ?Post-treatment instructions: Patient instructed to expect possible mild to moderate muscle soreness later today and/or tomorrow. Patient instructed in methods to reduce muscle soreness and to continue prescribed HEP. If patient was dry needled over the lung field, patient was instructed on signs and symptoms of pneumothorax and, however unlikely, to see immediate medical attention should they occur. Patient was also educated on signs and symptoms of infection and to seek medical attention should they occur. Patient verbalized understanding of these instructions and education.  ?  ?PATIENT EDUCATION:  ?Education details: HEP ?Person educated: Patient ?Education method: Explanation, Demonstration, Tactile cues, Verbal cues, and Handouts ?Education comprehension: verbalized understanding, returned demonstration, verbal cues required, tactile cues required, and needs further education ?  ?HOME EXERCISE PROGRAM: ?Access Code: [email or web address] ?  ?  ?ASSESSMENT: ?CLINICAL IMPRESSION: ?Patient tolerated therapy well with no adverse effects. *** ? ?Manual therapy was provided to the taut muscle and  TPs of the upper and mid back Pt was then instructed in techniques for mobility of the thoracic spine using a soft foam roller and for TrP massage utilizing a tennis ball and theracane. Pt tolerated the session without adverse effects. Pt will continue to benefit from from skilled PT to address impairments to optimize neck function with less pain. ?  ?OBJECTIVE IMPAIRMENTS decreased activity tolerance, decreased ROM, decreased strength,  postural dysfunction, and pain.  ?  ?ACTIVITY LIMITATIONS community activity, meal prep, occupation, and shopping.  ?  ?PERSONAL FACTORS Past/current experiences, Time since onset of injury/illness/exacerbation, and 1 comorbidity: Anxiety  are also affecting patient's functional outcome.  ?  ? ?GOALS: ?Goals reviewed with patient? Yes ?  ?SHORT TERM GOALS: Target date: 02/26/2022 ?  ?Patient will be I with initial HEP in order to progress with therapy. ?Baseline: HEP provided at evaluation ?Goal status: INITIAL ?  ?2.  PT will review FOTO with patient by 3rd visit in order to understand expected progress and outcome with therapy. ?Baseline: FOTO assessed at eval ?Goal status: INITIAL ?  ?3.  Patient will report 25% improvement in severity and frequency of headaches and/or neck pain to reduce functional limitations ?Baseline: patient reports pain up to 7-8/10 ?Goal status: INITIAL ?  ?LONG TERM GOALS: Target date: 03/26/2022 ?  ?Patient will be I with final HEP to maintain progress from PT. ?Baseline: HEP provided at evaluation ?Goal status: INITIAL ?  ?2.  Patient will report >/= 68% status on FOTO to indicate improved functional ability. ?Baseline: 57% functional status ?Goal status: INITIAL ?  ?3.  Patient will exhibit improved cervical rotation >/= 10 deg to indicate reduced muscle tension and improve functional mobility ?Baseline: left rotation 60 deg ?Goal status: INITIAL ?  ?4.  Patient will demonstrate DNF endurance >/= 20 seconds to improve postural control and reduce pain with  sitting extended periods ?Baseline: 11 seconds ?Goal status: INITIAL ?  ?  ?PLAN: ?PT FREQUENCY: 1-2x/week ?  ?PT DURATION: 8 weeks ?  ?PLANNED INTERVENTIONS: Therapeutic exercises, Therapeutic activit

## 2022-02-18 ENCOUNTER — Ambulatory Visit: Payer: Medicare PPO | Admitting: Physical Therapy

## 2022-02-23 ENCOUNTER — Ambulatory Visit: Payer: Medicare PPO

## 2022-02-23 DIAGNOSIS — M542 Cervicalgia: Secondary | ICD-10-CM

## 2022-02-23 DIAGNOSIS — M6281 Muscle weakness (generalized): Secondary | ICD-10-CM

## 2022-02-23 NOTE — Therapy (Signed)
?OUTPATIENT PHYSICAL THERAPY TREATMENT NOTE ? ? ?Patient Name: Julia Lee ?MRN: [id number] ?DOB:[birth year], 71 y.o., female ?Today's Date: 02/23/2022 ? ?PCP: Elby Showers, MD ?REFERRING PROVIDER: Genia Harold, MD ? ?END OF SESSION:  ? PT End of Session - 02/23/22 0913   ? ? Visit Number 5   ? Number of Visits 8   ? Date for PT Re-Evaluation 03/26/22   ? Authorization Type [employer] MCR   ? Progress Note Due on Visit 10   ? PT Start Time 1062   ? PT Stop Time 0955   ? PT Time Calculation (min) 40 min   ? Activity Tolerance Patient tolerated treatment well   ? Behavior During Therapy [facility] for tasks assessed/performed   ? ?  ?  ? ?  ? ? ? ? ? ?Past Medical History:  ?Diagnosis Date  ? Anxiety   ? Bipolar disorder ([place])   ? Cancer [facility])   ? rt arm squamous cell ca  ? History of migraine headaches   ? History of recurrent UTIs   ? Migraines   ? Osteoporosis   ? ?Past Surgical History:  ?Procedure Laterality Date  ? COLONOSCOPY    ? ROTATOR CUFF REPAIR Left   ? SKIN CANCER EXCISION    ? TOE SURGERY    ? ?Patient Active Problem List  ? Diagnosis Date Noted  ? Abdominal pain, epigastric 11/13/2021  ? Dysphagia 11/13/2021  ? Loss of weight 11/13/2021  ? Osteoporosis 02/08/2018  ? Tremor of both hands 02/07/2017  ? Bipolar disorder ([place]) 04/01/2011  ? History of migraine headaches 04/01/2011  ? ? ?REFERRING DIAG:  ?Cervicalgia ? ?THERAPY DIAG:  ?Cervicalgia ? ?Muscle weakness (generalized) ? ?PERTINENT HISTORY:  ?Anxiety ? ?PRECAUTIONS:  ?None ? ?SUBJECTIVE:  ?Pt presents to PT with continued slight neck pain and discomfort. Arrives with a theracane today, asks about setting it up. She is ready to begin PT at this time.  ? ?PAIN:  ?Are you having pain?  ?No: NPRS scale: 3/10 (5/10 at worst) ?Pain location: Neck, upper-mid back, left head ?Pain description: Constant, dull achy ?Aggravating factors: Light and loud noises ?Relieving factors: Medication ? ? ?OBJECTIVE: (objective measures completed at initial evaluation  unless otherwise dated) ? ?DIAGNOSTIC FINDINGS:  ?Negative ?  ?PATIENT SURVEYS:  ?FOTO 57% functional status ?  ?COGNITION: ?Overall cognitive status: Within functional limits for tasks assessed ?  ?SENSATION: ?WFL ?  ?POSTURE:  ?Rounded shoulder and forward head posture ?  ?PALPATION: ?Bilateral suboccipitals, L>R upper trap and periscapular region          ?  ?CERVICAL ROM:  ?  ?Active ROM A/PROM (deg) ?01/29/2022  ?Flexion 50  ?Extension 50  ?Right lateral flexion 15  ?Left lateral flexion 20  ?Right rotation 65  ?Left rotation 60  ?  ?UE ROM: ?                      UE AROM grossly WFL ?  ?UE MMT: ?  ?MMT Right ?01/29/2022 Left ?01/29/2022  ?Shoulder flexion 5 5  ?Shoulder extension 5 5  ?Shoulder abduction 5 5  ?Shoulder internal rotation 5 5  ?Shoulder external rotation 5 5  ?Periscap musculature 4 4  ?           ?CERVICAL SPECIAL TESTS:  ?Radicular testing negative ?  ?FUNCTIONAL TESTS:  ?DNF endurance test: 11 seconds ?  ?  ?TODAY'S TREATMENT ?[facility] Adult PT Treatment:  DATE: 02/23/2022 ?Therapeutic Exercise: ?UBE lvl 1.0 x 3 min fwd while taking subjective ?Row 2x10 17#  ?Seated horizontal abd 2x15 GTB ?Levator stretch x 30" L ?Standing chin tuck 2x10 - 3" hold ?Manual Therapy: ?STM and trigger point release to bilateral rhomboids and levator ?Positional release L upper trap ?Suboccipital release ?Trigger Point Dry Needling Treatment: ?Pre-treatment instruction: Patient instructed on dry needling rationale, procedures, and possible side effects including pain during treatment (achy,cramping feeling), bruising, drop of blood, lightheadedness, nausea, sweating. ?Patient Consent Given: Yes ?Education handout provided: No ?Muscles treated: bilateral upper traps ?Needle size and number: .30x64m x 4 ?Electrical stimulation performed: No ?Parameters: N/A ?Treatment response/outcome: Twitch response elicited and Palpable decrease in muscle tension ?Post-treatment  instructions: Patient instructed to expect possible mild to moderate muscle soreness later today and/or tomorrow. Patient instructed in methods to reduce muscle soreness and to continue prescribed HEP. If patient was dry needled over the lung field, patient was instructed on signs and symptoms of pneumothorax and, however unlikely, to see immediate medical attention should they occur. Patient was also educated on signs and symptoms of infection and to seek medical attention should they occur. Patient verbalized understanding of these instructions and education.  ? [facility]                                                DATE: 5/02/[facility] ?Therapeutic Exercise: ?UBE lvl 1.0 x 4 min, 2 mins each direction ?Manual Therapy: ?STM and MTPR to tight taut muscle and TrPs of the upper and mid back    ?Self Care: ?Instruction in the use of a soft foam roller for back massage and to promote thoracic extension with pt completing in standing with wall and chair. ?Instruction in the use of tennis ball and thercane for upper shoulder and upper/mid back TP massage  with pt using both to to tight and tender areas. ? ? [facility]                                                [facility]: [facility] ?Therapeutic Exercise: ?UBE lvl 1.0 x 4 min, 2 mins each direction ?Row 3x10 13#  ?Seated horizontal abd 2x10 GTB ?Supine serratus punch 2x10 2# each ?Levator stretch x 30" each ?S/L open book 2x10 each ?Supine chin tuck 2x10  ?Manual Therapy: ?Positional release L upper trap ?Suboccipital release ?Trigger point release L upper trap ? ?PATIENT EDUCATION:  ?Education details: Exam findings, POC, HEP, dry needling for future visits ?Person educated: Patient ?Education method: Explanation, Demonstration, Tactile cues, Verbal cues, and Handouts ?Education comprehension: verbalized understanding, returned demonstration, verbal cues required, tactile cues required, and needs further education ?  ?HOME EXERCISE  PROGRAM: ?Access Code: [email or web address] ?  ?  ?ASSESSMENT: ?CLINICAL IMPRESSION: ?Pt responded well to PT treatment today, noting decreased pain post manual therapy interventions. Therapy today focused on improving periscapular and DNF endurance in order to decrease pain and improve function. Pt is progressing well with therapy and will continue to be seen and progressed as able per POC.  ?  ?OBJECTIVE IMPAIRMENTS decreased activity tolerance, decreased ROM, decreased strength, postural dysfunction, and pain.  ?  ?ACTIVITY LIMITATIONS community activity, meal prep, occupation, and shopping.  ?  ?  PERSONAL FACTORS Past/current experiences, Time since onset of injury/illness/exacerbation, and 1 comorbidity: Anxiety  are also affecting patient's functional outcome.  ?  ?GOALS: ?Goals reviewed with patient? Yes ?  ?SHORT TERM GOALS: Target date: 02/26/2022 ?  ?Patient will be I with initial HEP in order to progress with therapy. ?Baseline: HEP provided at evaluation ?Goal status: INITIAL ?  ?2.  PT will review FOTO with patient by 3rd visit in order to understand expected progress and outcome with therapy. ?Baseline: FOTO assessed at eval ?Goal status: INITIAL ?  ?3.  Patient will report 25% improvement in severity and frequency of headaches and/or neck pain to reduce functional limitations ?Baseline: patient reports pain up to 7-8/10 ?Goal status: INITIAL ?  ?LONG TERM GOALS: Target date: 03/26/2022 ?  ?Patient will be I with final HEP to maintain progress from PT. ?Baseline: HEP provided at evaluation ?Goal status: INITIAL ?  ?2.  Patient will report >/= 68% status on FOTO to indicate improved functional ability. ?Baseline: 57% functional status ?Goal status: INITIAL ?  ?3.  Patient will exhibit improved cervical rotation >/= 10 deg to indicate reduced muscle tension and improve functional mobility ?Baseline: left rotation 60 deg ?Goal status: INITIAL ?  ?4.  Patient will demonstrate DNF endurance >/= 20 seconds to improve  postural control and reduce pain with sitting extended periods ?Baseline: 11 seconds ?Goal status: INITIAL ?  ?  ?PLAN: ?PT FREQUENCY: 1-2x/week ?  ?PT DURATION: 8 weeks ?  ?PLANNED INTERVENTIONS: Therapeutic exercises, Therapeutic a

## 2022-03-02 ENCOUNTER — Ambulatory Visit: Payer: Medicare PPO | Admitting: Orthopaedic Surgery

## 2022-03-02 ENCOUNTER — Other Ambulatory Visit: Payer: Self-pay

## 2022-03-02 ENCOUNTER — Encounter: Payer: Self-pay | Admitting: Orthopaedic Surgery

## 2022-03-02 ENCOUNTER — Telehealth: Payer: Self-pay | Admitting: Orthopaedic Surgery

## 2022-03-02 DIAGNOSIS — M7062 Trochanteric bursitis, left hip: Secondary | ICD-10-CM | POA: Diagnosis not present

## 2022-03-02 DIAGNOSIS — M7632 Iliotibial band syndrome, left leg: Secondary | ICD-10-CM

## 2022-03-02 MED ORDER — METHYLPREDNISOLONE ACETATE 40 MG/ML IJ SUSP
40.0000 mg | INTRAMUSCULAR | Status: AC | PRN
  Administered 2022-03-02: 40 mg via INTRA_ARTICULAR

## 2022-03-02 MED ORDER — LIDOCAINE HCL 1 % IJ SOLN
3.0000 mL | INTRAMUSCULAR | Status: AC | PRN
  Administered 2022-03-02: 3 mL

## 2022-03-02 NOTE — Telephone Encounter (Signed)
Pt called requesting her referral for physical therapy be sent to [facility] PT. Please call pt about this matter at [phone].

## 2022-03-02 NOTE — Telephone Encounter (Signed)
Referral placed in chart  

## 2022-03-02 NOTE — Progress Notes (Signed)
Bone density is relatively lower at the forearm, recommend starting Reclast infusion if she is okay with this

## 2022-03-02 NOTE — Progress Notes (Signed)
Office Visit Note   Patient: Julia Lee           Date of Birth: 09/16/1951           MRN: [id number] Visit Date: 03/02/2022              Requested by: Elby Showers, MD [address] [place],  [place] [address] PCP: Elby Showers, MD   Assessment & Plan: Visit Diagnoses:  1. It band syndrome, left   2. Trochanteric bursitis, left hip     Plan: It is appropriate that we get her into physical therapy for her left hip and she agrees with this.  I did recommend a steroid injection around the left hip and IT band.  She tolerated this well.  We will see her back in 6 weeks.  Since she is not a diabetic and this was not a joint, I would not be opposed to a second injection before she had to [place] for a hiking trip if she is still having pain.  Follow-Up Instructions: Return in about 6 weeks (around 04/13/2022).   Orders:  Orders Placed This Encounter  Procedures   Large Joint Inj   No orders of the defined types were placed in this encounter.     Procedures: Large Joint Inj: L greater trochanter on 03/02/2022 8:12 AM Indications: pain and diagnostic evaluation Details: 22 G 1.5 in needle, lateral approach  Arthrogram: No  Medications: 3 mL lidocaine 1 %; 40 mg methylPREDNISolone acetate 40 MG/ML Outcome: tolerated well, no immediate complications Procedure, treatment alternatives, risks and benefits explained, specific risks discussed. Consent was given by the patient. Immediately prior to procedure a time out was called to verify the correct patient, procedure, equipment, support staff and site/side marked as required. Patient was prepped and draped in the usual sterile fashion.      Clinical Data: No additional findings.   Subjective: Chief Complaint  Patient presents with   Left Hip - Pain, Follow-up  The patient is an active 71 year old female who is an avid hiker.  She comes in today with left hip pain from the trochanteric area down the IT band.   We seen her for this before.  She last had physical therapy about 5 years ago she states.  She has had x-rays of her hip and knee which were normal.  She says it hurts to lay on that side.  She denies any groin pain denies any pain past the knee.  She denies any numbness and tingling.  Is been only going on for about a month and a half.  She states she was bedridden for a while.  She is a thin individual and not a diabetic  HPI  Review of Systems There is currently listed no fever, chills, nausea, vomiting  Objective: Vital Signs: There were no vitals taken for this visit.  Physical Exam She is alert and orient x3 and in no acute distress.  She is very mobile. Ortho Exam Examination of her left hip and left knee are entirely normal.  She has pain over the trochanteric area and IT band only.  The remainder of her clinical exam orthopedically of the lower extremity is normal. Specialty Comments:  No specialty comments available.  Imaging: No results found.   PMFS History: Patient Active Problem List   Diagnosis Date Noted   Abdominal pain, epigastric 11/13/2021   Dysphagia 11/13/2021   Loss of weight 11/13/2021   Osteoporosis 02/08/2018   Tremor of  both hands 02/07/2017   Bipolar disorder ([place]) 04/01/2011   History of migraine headaches 04/01/2011   Past Medical History:  Diagnosis Date   Anxiety    Bipolar disorder ([place])    Cancer ([place])    rt arm squamous cell ca   History of migraine headaches    History of recurrent UTIs    Migraines    Osteoporosis     Family History  Problem Relation Age of Onset   Pancreatic cancer Mother    Heart disease Father    Suicidality Sister    Breast cancer Maternal Grandmother    Diabetes Maternal Grandmother    Colon cancer Neg Hx    Colon polyps Neg Hx    Esophageal cancer Neg Hx    Stomach cancer Neg Hx    Rectal cancer Neg Hx    Thyroid disease Neg Hx     Past Surgical History:  Procedure Laterality Date   COLONOSCOPY      ROTATOR CUFF REPAIR Left    SKIN CANCER EXCISION     TOE SURGERY     Social History   Occupational History   Occupation: middle Microbiologist    Comment: retired  Tobacco Use   Smoking status: Never   Smokeless tobacco: Never  Vaping Use   Vaping Use: Never used  Substance and Sexual Activity   Alcohol use: Yes    Alcohol/week: 14.0 standard drinks    Types: 14 Glasses of wine per week    Comment: 1 glasses of wine a night   Drug use: No   Sexual activity: Not on file

## 2022-03-03 ENCOUNTER — Ambulatory Visit: Payer: Medicare PPO

## 2022-03-03 DIAGNOSIS — M542 Cervicalgia: Secondary | ICD-10-CM | POA: Diagnosis not present

## 2022-03-03 DIAGNOSIS — M6281 Muscle weakness (generalized): Secondary | ICD-10-CM

## 2022-03-03 NOTE — Therapy (Addendum)
OUTPATIENT PHYSICAL THERAPY TREATMENT NOTE  DISCHARGE   Patient Name: Julia Lee MRN: [id number] DOB:[birth year], 71 y.o., female Today's Date: 03/03/2022  PCP: Elby Showers, MD REFERRING PROVIDER: Genia Harold, MD  END OF SESSION:   PT End of Session - 03/03/22 0858     Visit Number 6    Number of Visits 8    Date for PT Re-Evaluation 03/26/22    Authorization Type [employer] MCR    Progress Note Due on Visit 10    PT Start Time 0851    PT Stop Time 0934    PT Time Calculation (min) 43 min    Activity Tolerance Patient tolerated treatment well    Behavior During Therapy [facility] for tasks assessed/performed                 Past Medical History:  Diagnosis Date   Anxiety    Bipolar disorder ([place])    Cancer ([place])    rt arm squamous cell ca   History of migraine headaches    History of recurrent UTIs    Migraines    Osteoporosis    Past Surgical History:  Procedure Laterality Date   COLONOSCOPY     ROTATOR CUFF REPAIR Left    SKIN CANCER EXCISION     TOE SURGERY     Patient Active Problem List   Diagnosis Date Noted   Abdominal pain, epigastric 11/13/2021   Dysphagia 11/13/2021   Loss of weight 11/13/2021   Osteoporosis 02/08/2018   Tremor of both hands 02/07/2017   Bipolar disorder ([place]) 04/01/2011   History of migraine headaches 04/01/2011    REFERRING DIAG:  Cervicalgia  THERAPY DIAG:  Cervicalgia  Muscle weakness (generalized)  PERTINENT HISTORY:  Anxiety  PRECAUTIONS:  None  SUBJECTIVE:  I've been going to the gym. I feel knots in my midback. Overall, my pain is better.  PAIN:  Are you having pain?  No: NPRS scale: 2/10 (5/10 at worst) Pain location: Neck, upper-mid back, left head Pain description: Constant, dull achy Aggravating factors: Light and loud noises Relieving factors: Medication   OBJECTIVE: (objective measures completed at initial evaluation unless otherwise dated)  DIAGNOSTIC FINDINGS:  Negative    PATIENT SURVEYS:  FOTO 57% functional status   COGNITION: Overall cognitive status: Within functional limits for tasks assessed   SENSATION: WFL   POSTURE:  Rounded shoulder and forward head posture   PALPATION: Bilateral suboccipitals, L>R upper trap and periscapular region            CERVICAL ROM:    Active ROM A/PROM (deg) 01/29/2022  Flexion 50  Extension 50  Right lateral flexion 15  Left lateral flexion 20  Right rotation 65  Left rotation 60    UE ROM:                       UE AROM grossly WFL   UE MMT:   MMT Right 01/29/2022 Left 01/29/2022  Shoulder flexion 5 5  Shoulder extension 5 5  Shoulder abduction 5 5  Shoulder internal rotation 5 5  Shoulder external rotation 5 5  Periscap musculature 4 4             CERVICAL SPECIAL TESTS:  Radicular testing negative   FUNCTIONAL TESTS:  DNF endurance test: 11 seconds     TODAY'S TREATMENT  [facility] Adult PT Treatment:  DATE: 03/03/22 Therapeutic Exercise: UBE lvl 1.0 x 3 min fwd while taking subjective Cat/camel x3 20" Child's pose forwad and lateral 2x each 20" Seated horizontal abd 2x10 GTB Shoulder ext 2x10 GTB  Manual Therapy: STM with MTPR to the midback paraspinals and rhomboids   Self Care: Instruction in the use of the theracane for self massage  Trigger Point Dry Needling Treatment: Pre-treatment instruction: Patient instructed on dry needling rationale, procedures, and possible side effects including pain during treatment (achy,cramping feeling), bruising, drop of blood, lightheadedness, nausea, sweating. Patient Consent Given: Yes Education handout provided: No Muscles treated: bilateral T1-T7 paraspinals and rhomboids Needle size and number: .30x66m x 2 Electrical stimulation performed: No Parameters: N/A Treatment response/outcome: Twitch response elicited and Palpable decrease in muscle tension Post-treatment instructions: Patient  instructed to expect possible mild to moderate muscle soreness later today and/or tomorrow. Patient instructed in methods to reduce muscle soreness and to continue prescribed HEP. If patient was dry needled over the lung field, patient was instructed on signs and symptoms of pneumothorax and, however unlikely, to see immediate medical attention should they occur. Patient was also educated on signs and symptoms of infection and to seek medical attention should they occur. Patient verbalized understanding of these instructions and education.   [facility]:                                                DATE: 02/23/2022 Therapeutic Exercise: UBE lvl 1.0 x 3 min fwd while taking subjective Row 2x10 17#  Seated horizontal abd 2x15 GTB Levator stretch x 30" L Standing chin tuck 2x10 - 3" hold Manual Therapy: STM and trigger point release to bilateral rhomboids and levator Positional release L upper trap Suboccipital release Trigger Point Dry Needling Treatment: Pre-treatment instruction: Patient instructed on dry needling rationale, procedures, and possible side effects including pain during treatment (achy,cramping feeling), bruising, drop of blood, lightheadedness, nausea, sweating. Patient Consent Given: Yes Education handout provided: No Muscles treated: bilateral upper traps Needle size and number: .30x329mx 4 Electrical stimulation performed: No Parameters: N/A Treatment response/outcome: Twitch response elicited and Palpable decrease in muscle tension Post-treatment instructions: Patient instructed to expect possible mild to moderate muscle soreness later today and/or tomorrow. Patient instructed in methods to reduce muscle soreness and to continue prescribed HEP. If patient was dry needled over the lung field, patient was instructed on signs and symptoms of pneumothorax and, however unlikely, to see immediate medical attention should they occur. Patient was also educated on signs  and symptoms of infection and to seek medical attention should they occur. Patient verbalized understanding of these instructions and education.   [facility]:                                                DATE: 5/02/12/22 Therapeutic Exercise: UBE lvl 1.0 x 4 min, 2 mins each direction Manual Therapy: STM and MTPR to tight taut muscle and TrPs of the upper and mid back    Self Care: Instruction in the use of a soft foam roller for back massage and to promote thoracic extension with pt completing in standing with wall and chair. Instruction in the use of tennis ball and thercane  for upper shoulder and upper/mid back TP massage  with pt using both to to tight and tender areas.   [facility] Adult PT Treatment:                                                DATE: 02/04/2022 Therapeutic Exercise: UBE lvl 1.0 x 4 min, 2 mins each direction Row 3x10 13#  Seated horizontal abd 2x10 GTB Supine serratus punch 2x10 2# each Levator stretch x 30" each S/L open book 2x10 each Supine chin tuck 2x10  Manual Therapy: Positional release L upper trap Suboccipital release Trigger point release L upper trap  PATIENT EDUCATION:  Education details: Exam findings, POC, HEP, dry needling for future visits Person educated: Patient Education method: Explanation, Demonstration, Tactile cues, Verbal cues, and Handouts Education comprehension: verbalized understanding, returned demonstration, verbal cues required, tactile cues required, and needs further education   HOME EXERCISE PROGRAM: Access Code: [email or web address]     ASSESSMENT: CLINICAL IMPRESSION: PT was completed for manual therapy and TPDN to the thoracic paraspinals and rhomboids f/b flexibility and strengthening of these muscles. Pt obtained a theracane and was instructed in how to effectively use the theracane for self TP massage. Pt tolerated the session well. Pt is responding appropriately to PT.   OBJECTIVE IMPAIRMENTS decreased activity  tolerance, decreased ROM, decreased strength, postural dysfunction, and pain.    ACTIVITY LIMITATIONS community activity, meal prep, occupation, and shopping.    PERSONAL FACTORS Past/current experiences, Time since onset of injury/illness/exacerbation, and 1 comorbidity: Anxiety  are also affecting patient's functional outcome.    GOALS: Goals reviewed with patient? Yes   SHORT TERM GOALS: Target date: 02/26/2022   Patient will be I with initial HEP in order to progress with therapy. Baseline: HEP provided at evaluation Goal status: INITIAL   2.  PT will review FOTO with patient by 3rd visit in order to understand expected progress and outcome with therapy. Baseline: FOTO assessed at eval Goal status: INITIAL   3.  Patient will report 25% improvement in severity and frequency of headaches and/or neck pain to reduce functional limitations Baseline: patient reports pain up to 7-8/10 Goal status: INITIAL   LONG TERM GOALS: Target date: 03/26/2022   Patient will be I with final HEP to maintain progress from PT. Baseline: HEP provided at evaluation Goal status: INITIAL   2.  Patient will report >/= 68% status on FOTO to indicate improved functional ability. Baseline: 57% functional status Goal status: INITIAL   3.  Patient will exhibit improved cervical rotation >/= 10 deg to indicate reduced muscle tension and improve functional mobility Baseline: left rotation 60 deg Goal status: INITIAL   4.  Patient will demonstrate DNF endurance >/= 20 seconds to improve postural control and reduce pain with sitting extended periods Baseline: 11 seconds Goal status: INITIAL     PLAN: PT FREQUENCY: 1-2x/week   PT DURATION: 8 weeks   PLANNED INTERVENTIONS: Therapeutic exercises, Therapeutic activity, Neuromuscular re-education, Balance training, Gait training, Patient/Family education, Joint manipulation, Joint mobilization, Aquatic Therapy, Dry Needling, Electrical stimulation, Spinal  manipulation, Spinal mobilization, Cryotherapy, Moist heat, Taping, and Manual therapy   PLAN FOR NEXT SESSION: Review HEP and progress PRN, manual/dry needling for suboccipital/upper trap/periscapular region, DNF endurance and postural strengthening, pec stretch, instructed on SMFR using tennis ball for home use  [employer] MS, PT 03/03/22 1:29 PM  PHYSICAL THERAPY DISCHARGE SUMMARY  Visits from Start of Care: 6  Current functional level related to goals / functional outcomes: See above   Remaining deficits: See above   Education / Equipment: HEP   Patient agrees to discharge. Patient goals were partially met. Patient is being discharged due to being pleased with the current functional level.  Hilda Blades, PT, DPT, LAT, ATC 03/30/22  10:24 AM Phone: [phone] Fax: [phone]

## 2022-03-09 ENCOUNTER — Ambulatory Visit: Payer: Medicare PPO

## 2022-03-12 ENCOUNTER — Other Ambulatory Visit: Payer: Medicare PPO

## 2022-03-12 DIAGNOSIS — Z136 Encounter for screening for cardiovascular disorders: Secondary | ICD-10-CM | POA: Diagnosis not present

## 2022-03-13 LAB — LIPID PANEL
Cholesterol: 182 mg/dL (ref <200)
HDL: 75 mg/dL (ref >=50)
LDL Cholesterol (Calc): 91 mg/dL (calc)
Non-HDL Cholesterol (Calc): 107 mg/dL (calc) (ref <130)
Total CHOL/HDL Ratio: 2.4 (calc) (ref <5.0)
Triglycerides: 73 mg/dL (ref <150)

## 2022-03-16 ENCOUNTER — Encounter: Payer: Self-pay | Admitting: Internal Medicine

## 2022-03-16 ENCOUNTER — Ambulatory Visit (INDEPENDENT_AMBULATORY_CARE_PROVIDER_SITE_OTHER): Payer: Medicare PPO | Admitting: Internal Medicine

## 2022-03-16 ENCOUNTER — Ambulatory Visit: Payer: Medicare PPO

## 2022-03-16 VITALS — BP 132/82 | HR 61 | Temp 97.5°F | Ht 63.0 in | Wt 118.5 lb

## 2022-03-16 DIAGNOSIS — Z0000 Encounter for general adult medical examination without abnormal findings: Secondary | ICD-10-CM

## 2022-03-16 DIAGNOSIS — K219 Gastro-esophageal reflux disease without esophagitis: Secondary | ICD-10-CM | POA: Diagnosis not present

## 2022-03-16 DIAGNOSIS — N390 Urinary tract infection, site not specified: Secondary | ICD-10-CM

## 2022-03-16 DIAGNOSIS — R82998 Other abnormal findings in urine: Secondary | ICD-10-CM

## 2022-03-16 DIAGNOSIS — B9620 Unspecified Escherichia coli [E. coli] as the cause of diseases classified elsewhere: Secondary | ICD-10-CM

## 2022-03-16 DIAGNOSIS — M810 Age-related osteoporosis without current pathological fracture: Secondary | ICD-10-CM

## 2022-03-16 DIAGNOSIS — E876 Hypokalemia: Secondary | ICD-10-CM

## 2022-03-16 DIAGNOSIS — Z8659 Personal history of other mental and behavioral disorders: Secondary | ICD-10-CM

## 2022-03-16 DIAGNOSIS — Z8669 Personal history of other diseases of the nervous system and sense organs: Secondary | ICD-10-CM | POA: Diagnosis not present

## 2022-03-16 DIAGNOSIS — M7632 Iliotibial band syndrome, left leg: Secondary | ICD-10-CM | POA: Diagnosis not present

## 2022-03-16 LAB — POCT URINALYSIS DIPSTICK
Bilirubin, UA: NEGATIVE
Blood, UA: NEGATIVE
Glucose, UA: NEGATIVE
Nitrite, UA: NEGATIVE
Protein, UA: NEGATIVE
Spec Grav, UA: <=1.005 — AB (ref 1.010–1.025)
Urobilinogen, UA: 0.2 E.U./dL
pH, UA: 6.5 (ref 5.0–8.0)

## 2022-03-16 NOTE — Progress Notes (Signed)
Annual Wellness Visit     Patient: Julia Lee, Female    DOB: 07-07-1951, 71 y.o.   MRN: [id number] Visit Date: 03/16/2022  Chief Complaint  Patient presents with   Medicare Wellness        Subjective    Julia Lee is a 71 y.o. female who presents today for her Annual Wellness Visit.  HPI 71 year old Female seen for health maintenance exam and evaluation of medical issues. Has seen Anderson Malta Chima,M.D. at [facility] for migraine headaches and is doing better.Is on Emgality injection monthly per Neurologist.  Now seen by Dr. Delane Ginger, Endocrinologist in [place].  Has history of hypercalcemia.  Eleonore Chiquito is her new Psychiatrist and lithium dose is 300 mg twice daily currently.  Having IT band issues left leg and is going to PT  Has T score in distal radius -3.3 consistent with osteoporosis.  Needs to address this with her new endocrinologist in [facility].  Had colonoscopy in 2019 with Dr. Havery Moros.  Colonoscopy was normal and 10-year follow-up was recommended.  She also had upper endoscopy in February 2023 by Dr. Havery Moros with complaint of epigastric pain and weight loss.  Biopsies of small intestine were normal.  She and her husband want to spend time in [place] and they hope to be able to travel soon.  She is feeling better from a mental aspect and a physical aspect.  Her urine specimen is abnormal and was sent for culture which grew E. coli.  She was treated with Cipro 250 mg twice daily for 5 days and had repeat urine specimen on June 16 which was negative.   Social history: She is married.  Husband is a Health and safety inspector at [employer].  1 adult daughter and 1 grandchild.  Patient does not smoke.  Seldom consumes alcohol.   Patient Care Team: Elby Showers, MD as PCP - General (Internal Medicine)  Review of Systems feeling less better with less frequent migraine headaches and less anxious   Objective    Vitals: BP  132/82   Pulse 61   Temp (!) 97.5 F (36.4 C) (Tympanic)   Ht '5\' 3"'$  (1.6 m)   Wt 118 lb 8 oz (53.8 kg)   SpO2 99%   BMI 20.99 kg/m   Physical Exam Skin: Warm and dry.  No cervical adenopathy, thyromegaly or carotid bruits.  TMs clear.  Pharynx clear.  Neck is supple.  Chest clear.  Cardiac exam: Regular rate and rhythm without ectopy.  Abdomen is soft nondistended without hepatosplenomegaly masses or tenderness.  No lower extremity pitting edema.  Brief neurological exam is intact without focal deficits.  Most recent functional status assessment:    03/16/2022    3:11 PM  In your present state of health, do you have any difficulty performing the following activities:  Hearing? 0  Vision? 0  Difficulty concentrating or making decisions? 0  Walking or climbing stairs? 0  Dressing or bathing? 0  Doing errands, shopping? 0  Preparing Food and eating ? N  Using the Toilet? N  In the past six months, have you accidently leaked urine? N  Do you have problems with loss of bowel control? N  Managing your Medications? N  Managing your Finances? N  Housekeeping or managing your Housekeeping? N   Most recent fall risk assessment:    03/16/2022    3:11 PM  [place] in the past year? 0  Number  falls in past yr: 0  Injury with Fall? 0  Risk for fall due to : No Fall Risks  Follow up Falls evaluation completed    Most recent depression screenings:    03/16/2022    3:11 PM 11/12/2019    2:11 PM  PHQ 2/9 Scores  PHQ - 2 Score 0 0   Most recent cognitive screening:    03/16/2022    3:12 PM  6CIT Screen  What Year? 0 points  What month? 0 points  What time? 0 points  Count back from 20 0 points  Months in reverse 0 points       Assessment & Plan     Annual wellness visit done today including the all of the following: Reviewed patient's Family Medical History Reviewed and updated list of patient's medical providers Assessment of cognitive impairment was done Assessed  patient's functional ability Established a written schedule for health screening [place] Completed and Reviewed  Discussed health benefits of physical activity, and encouraged her to engage in regular exercise appropriate for her age and condition.        IElby Showers, MD, have reviewed all documentation for this visit. The documentation on 04/26/22 for the exam, diagnosis, procedures, and orders are all accurate and complete.    Angus Seller, CMA

## 2022-03-16 NOTE — Patient Instructions (Addendum)
It was a pleasure to see you today. We are glad your headaches are better. Follow up in one year or as needed. Neurologist had suggested PT for   pain. Having PT for IT band issues.  Continue follow up with your Specialists.  Mammogram ordered.  Take Cipro 250 mg twice daily for 5 days for UTI

## 2022-03-17 DIAGNOSIS — M25552 Pain in left hip: Secondary | ICD-10-CM | POA: Diagnosis not present

## 2022-03-19 LAB — URINE CULTURE
MICRO NUMBER:: 13489434
SPECIMEN QUALITY:: ADEQUATE

## 2022-03-19 MED ORDER — CIPROFLOXACIN HCL 250 MG PO TABS: 250.0000 mg | ORAL_TABLET | Freq: Two times a day (BID) | ORAL | 0 refills | Status: AC

## 2022-03-22 NOTE — Therapy (Incomplete)
OUTPATIENT PHYSICAL THERAPY TREATMENT NOTE   Patient Name: Julia Lee MRN: [id number] DOB:May 24, 1951, 71 y.o., female Today's Date: 03/22/2022  PCP: Elby Showers, MD REFERRING PROVIDER: Elby Showers, MD  END OF SESSION:         Past Medical History:  Diagnosis Date   Anxiety    Bipolar disorder ([place])    Cancer ([place])    rt arm squamous cell ca   History of migraine headaches    History of recurrent UTIs    Migraines    Osteoporosis    Past Surgical History:  Procedure Laterality Date   COLONOSCOPY     ROTATOR CUFF REPAIR Left    SKIN CANCER EXCISION     TOE SURGERY     Patient Active Problem List   Diagnosis Date Noted   Abdominal pain, epigastric 11/13/2021   Dysphagia 11/13/2021   Loss of weight 11/13/2021   Osteoporosis 02/08/2018   Tremor of both hands 02/07/2017   Bipolar disorder ([place]) 04/01/2011   History of migraine headaches 04/01/2011    REFERRING DIAG:  Cervicalgia  THERAPY DIAG:  No diagnosis found.  PERTINENT HISTORY:  Anxiety  PRECAUTIONS:  None  SUBJECTIVE:  I've been going to the gym. I feel knots in my midback. Overall, my pain is better.  PAIN:  Are you having pain?  No: NPRS scale: 2/10 (5/10 at worst) Pain location: Neck, upper-mid back, left head Pain description: Constant, dull achy Aggravating factors: Light and loud noises Relieving factors: Medication   OBJECTIVE: (objective measures completed at initial evaluation unless otherwise dated)  DIAGNOSTIC FINDINGS:  Negative   PATIENT SURVEYS:  FOTO 57% functional status   COGNITION: Overall cognitive status: Within functional limits for tasks assessed   SENSATION: WFL   POSTURE:  Rounded shoulder and forward head posture   PALPATION: Bilateral suboccipitals, L>R upper trap and periscapular region            CERVICAL ROM:    Active ROM A/PROM (deg) 01/29/2022  Flexion 50  Extension 50  Right lateral flexion 15  Left lateral flexion 20   Right rotation 65  Left rotation 60    UE ROM:                       UE AROM grossly WFL   UE MMT:   MMT Right 01/29/2022 Left 01/29/2022  Shoulder flexion 5 5  Shoulder extension 5 5  Shoulder abduction 5 5  Shoulder internal rotation 5 5  Shoulder external rotation 5 5  Periscap musculature 4 4             CERVICAL SPECIAL TESTS:  Radicular testing negative   FUNCTIONAL TESTS:  DNF endurance test: 11 seconds     TODAY'S TREATMENT [facility] Adult PT Treatment:                                                DATE: 03/23/2022 Therapeutic Exercise: *** Manual Therapy: *** Neuromuscular re-ed: *** Therapeutic Activity: *** Modalities: *** Self Care: [facility] Adult PT Treatment:  DATE: 03/03/22 Therapeutic Exercise: UBE lvl 1.0 x 3 min fwd while taking subjective Cat/camel x3 20" Child's pose forwad and lateral 2x each 20" Seated horizontal abd 2x10 GTB Shoulder ext 2x10 GTB Manual Therapy: STM with MTPR to the midback paraspinals and rhomboids Self Care: Instruction in the use of the theracane for self massage Trigger Point Dry Needling Treatment: Pre-treatment instruction: Patient instructed on dry needling rationale, procedures, and possible side effects including pain during treatment (achy,cramping feeling), bruising, drop of blood, lightheadedness, nausea, sweating. Patient Consent Given: Yes Education handout provided: No Muscles treated: bilateral T1-T7 paraspinals and rhomboids Needle size and number: .30x58m x 2 Electrical stimulation performed: No Parameters: N/A Treatment response/outcome: Twitch response elicited and Palpable decrease in muscle tension Post-treatment instructions: Patient instructed to expect possible mild to moderate muscle soreness later today and/or tomorrow. Patient instructed in methods to reduce muscle soreness and to continue prescribed HEP. If patient was dry needled over the lung  field, patient was instructed on signs and symptoms of pneumothorax and, however unlikely, to see immediate medical attention should they occur. Patient was also educated on signs and symptoms of infection and to seek medical attention should they occur. Patient verbalized understanding of these instructions and education.   [facility] PT Treatment:                                                DATE: 02/23/2022 Therapeutic Exercise: UBE lvl 1.0 x 3 min fwd while taking subjective Row 2x10 17#  Seated horizontal abd 2x15 GTB Levator stretch x 30" L Standing chin tuck 2x10 - 3" hold Manual Therapy: STM and trigger point release to bilateral rhomboids and levator Positional release L upper trap Suboccipital release Trigger Point Dry Needling Treatment: Pre-treatment instruction: Patient instructed on dry needling rationale, procedures, and possible side effects including pain during treatment (achy,cramping feeling), bruising, drop of blood, lightheadedness, nausea, sweating. Patient Consent Given: Yes Education handout provided: No Muscles treated: bilateral upper traps Needle size and number: .30x330mx 4 Electrical stimulation performed: No Parameters: N/A Treatment response/outcome: Twitch response elicited and Palpable decrease in muscle tension Post-treatment instructions: Patient instructed to expect possible mild to moderate muscle soreness later today and/or tomorrow. Patient instructed in methods to reduce muscle soreness and to continue prescribed HEP. If patient was dry needled over the lung field, patient was instructed on signs and symptoms of pneumothorax and, however unlikely, to see immediate medical attention should they occur. Patient was also educated on signs and symptoms of infection and to seek medical attention should they occur. Patient verbalized understanding of these instructions and education.   [facility] PT Treatment:                                                 DATE: 5/02/12/22 Therapeutic Exercise: UBE lvl 1.0 x 4 min, 2 mins each direction Manual Therapy: STM and MTPR to tight taut muscle and TrPs of the upper and mid back    Self Care: Instruction in the use of a soft foam roller for back massage and to promote thoracic extension with pt completing in standing with wall and chair. Instruction in the use of tennis ball and thercane for upper shoulder and  upper/mid back TP massage  with pt using both to to tight and tender areas.  [facility] Adult PT Treatment:                                                DATE: 02/04/2022 Therapeutic Exercise: UBE lvl 1.0 x 4 min, 2 mins each direction Row 3x10 13#  Seated horizontal abd 2x10 GTB Supine serratus punch 2x10 2# each Levator stretch x 30" each S/L open book 2x10 each Supine chin tuck 2x10  Manual Therapy: Positional release L upper trap Suboccipital release Trigger point release L upper trap  PATIENT EDUCATION:  Education details: Exam findings, POC, HEP, dry needling for future visits Person educated: Patient Education method: Explanation, Demonstration, Tactile cues, Verbal cues, and Handouts Education comprehension: verbalized understanding, returned demonstration, verbal cues required, tactile cues required, and needs further education   HOME EXERCISE PROGRAM: Access Code: [email or web address]     ASSESSMENT: CLINICAL IMPRESSION: ***  PT was completed for manual therapy and TPDN to the thoracic paraspinals and rhomboids f/b flexibility and strengthening of these muscles. Pt obtained a theracane and was instructed in how to effectively use the theracane for self TP massage. Pt tolerated the session well. Pt is responding appropriately to PT.   OBJECTIVE IMPAIRMENTS decreased activity tolerance, decreased ROM, decreased strength, postural dysfunction, and pain.    ACTIVITY LIMITATIONS community activity, meal prep, occupation, and shopping.    PERSONAL FACTORS Past/current experiences, Time since  onset of injury/illness/exacerbation, and 1 comorbidity: Anxiety  are also affecting patient's functional outcome.     GOALS: Goals reviewed with patient? Yes   SHORT TERM GOALS: Target date: 02/26/2022   Patient will be I with initial HEP in order to progress with therapy. Baseline: HEP provided at evaluation Goal status: INITIAL   2.  PT will review FOTO with patient by 3rd visit in order to understand expected progress and outcome with therapy. Baseline: FOTO assessed at eval Goal status: INITIAL   3.  Patient will report 25% improvement in severity and frequency of headaches and/or neck pain to reduce functional limitations Baseline: patient reports pain up to 7-8/10 Goal status: INITIAL   LONG TERM GOALS: Target date: 03/26/2022   Patient will be I with final HEP to maintain progress from PT. Baseline: HEP provided at evaluation Goal status: INITIAL   2.  Patient will report >/= 68% status on FOTO to indicate improved functional ability. Baseline: 57% functional status Goal status: INITIAL   3.  Patient will exhibit improved cervical rotation >/= 10 deg to indicate reduced muscle tension and improve functional mobility Baseline: left rotation 60 deg Goal status: INITIAL   4.  Patient will demonstrate DNF endurance >/= 20 seconds to improve postural control and reduce pain with sitting extended periods Baseline: 11 seconds Goal status: INITIAL     PLAN: PT FREQUENCY: 1-2x/week   PT DURATION: 8 weeks   PLANNED INTERVENTIONS: Therapeutic exercises, Therapeutic activity, Neuromuscular re-education, Balance training, Gait training, Patient/Family education, Joint manipulation, Joint mobilization, Aquatic Therapy, Dry Needling, Electrical stimulation, Spinal manipulation, Spinal mobilization, Cryotherapy, Moist heat, Taping, and Manual therapy   PLAN FOR NEXT SESSION: Review HEP and progress PRN, manual/dry needling for suboccipital/upper trap/periscapular region, DNF  endurance and postural strengthening, pec stretch, instructed on SMFR using tennis ball for home use   Hilda Blades, PT, DPT, LAT, ATC 03/22/22  4:26 PM Phone: [phone] Fax: [phone]

## 2022-03-23 ENCOUNTER — Ambulatory Visit: Payer: Medicare PPO | Admitting: Physical Therapy

## 2022-03-25 DIAGNOSIS — M25552 Pain in left hip: Secondary | ICD-10-CM | POA: Diagnosis not present

## 2022-03-26 ENCOUNTER — Other Ambulatory Visit (INDEPENDENT_AMBULATORY_CARE_PROVIDER_SITE_OTHER): Payer: Medicare PPO

## 2022-03-26 DIAGNOSIS — R82998 Other abnormal findings in urine: Secondary | ICD-10-CM

## 2022-03-26 DIAGNOSIS — B9620 Unspecified Escherichia coli [E. coli] as the cause of diseases classified elsewhere: Secondary | ICD-10-CM | POA: Diagnosis not present

## 2022-03-26 DIAGNOSIS — N390 Urinary tract infection, site not specified: Secondary | ICD-10-CM

## 2022-03-27 LAB — URINALYSIS W MICROSCOPIC + REFLEX CULTURE
Bacteria, UA: NONE SEEN /HPF
Bilirubin Urine: NEGATIVE
Glucose, UA: NEGATIVE
Hgb urine dipstick: NEGATIVE
Hyaline Cast: NONE SEEN /LPF
Ketones, ur: NEGATIVE
Leukocyte Esterase: NEGATIVE
Nitrites, Initial: NEGATIVE
Protein, ur: NEGATIVE
RBC / HPF: NONE SEEN /HPF (ref 0–2)
Specific Gravity, Urine: 1.007 (ref 1.001–1.035)
Squamous Epithelial / HPF: NONE SEEN /HPF (ref <=5)
WBC, UA: NONE SEEN /HPF (ref 0–5)
pH: 6.5 (ref 5.0–8.0)

## 2022-03-27 LAB — NO CULTURE INDICATED

## 2022-03-27 NOTE — Addendum Note (Signed)
Addended by: Elby Showers on: 03/27/2022 03:04 PM   Modules accepted: Level of Service

## 2022-03-27 NOTE — Progress Notes (Addendum)
Urine recheck s/p treatment at last visit for E.coli UTI is normal.  This was  a nurse visit only. Urine was not cultured.  MJB, MD

## 2022-03-31 DIAGNOSIS — M25552 Pain in left hip: Secondary | ICD-10-CM | POA: Diagnosis not present

## 2022-03-31 DIAGNOSIS — R7889 Finding of other specified substances, not normally found in blood: Secondary | ICD-10-CM | POA: Diagnosis not present

## 2022-03-31 DIAGNOSIS — Z5181 Encounter for therapeutic drug level monitoring: Secondary | ICD-10-CM | POA: Diagnosis not present

## 2022-04-07 ENCOUNTER — Ambulatory Visit
Admission: RE | Admit: 2022-04-07 | Discharge: 2022-04-07 | Disposition: A | Discharge status: Home / Self Care | Payer: Medicare PPO | Source: Ambulatory Visit | Attending: Internal Medicine | Admitting: Internal Medicine

## 2022-04-07 DIAGNOSIS — Z1231 Encounter for screening mammogram for malignant neoplasm of breast: Secondary | ICD-10-CM | POA: Diagnosis not present

## 2022-04-15 DIAGNOSIS — K130 Diseases of lips: Secondary | ICD-10-CM | POA: Diagnosis not present

## 2022-04-15 DIAGNOSIS — L309 Dermatitis, unspecified: Secondary | ICD-10-CM | POA: Diagnosis not present

## 2022-04-15 DIAGNOSIS — Z85828 Personal history of other malignant neoplasm of skin: Secondary | ICD-10-CM | POA: Diagnosis not present

## 2022-04-19 ENCOUNTER — Encounter: Payer: Self-pay | Admitting: Internal Medicine

## 2022-04-19 NOTE — Telephone Encounter (Signed)
I will need to get your visit dictated. I have been on vacation.

## 2022-04-21 DIAGNOSIS — M25552 Pain in left hip: Secondary | ICD-10-CM | POA: Diagnosis not present

## 2022-04-27 DIAGNOSIS — M25552 Pain in left hip: Secondary | ICD-10-CM | POA: Diagnosis not present

## 2022-04-28 ENCOUNTER — Ambulatory Visit: Payer: Medicare PPO | Admitting: Psychiatry

## 2022-04-28 ENCOUNTER — Encounter: Payer: Self-pay | Admitting: Psychiatry

## 2022-04-28 ENCOUNTER — Other Ambulatory Visit: Payer: Self-pay | Admitting: Psychiatry

## 2022-04-28 VITALS — BP 133/86 | HR 65 | Ht 63.0 in | Wt 113.0 lb

## 2022-04-28 DIAGNOSIS — G629 Polyneuropathy, unspecified: Secondary | ICD-10-CM

## 2022-04-28 DIAGNOSIS — G43019 Migraine without aura, intractable, without status migrainosus: Secondary | ICD-10-CM

## 2022-04-28 MED ORDER — EMGALITY 120 MG/ML ~~LOC~~ SOAJ: 1.0000 "pen " | SUBCUTANEOUS | 11 refills | Status: DC

## 2022-04-28 MED ORDER — ZOLMITRIPTAN 5 MG NA SOLN: 1.0000 | NASAL | 11 refills | Status: DC | PRN

## 2022-04-28 NOTE — Patient Instructions (Addendum)
Blood work today to look for causes of neuropathy - hemoglobin A1c, B12 levels, myeloma panel  EMG to test for neuropathy

## 2022-04-28 NOTE — Progress Notes (Signed)
   CC:  headaches  Follow-up Visit  Last visit: 01/18/22  Brief HPI: 71 year old female with a history of bipolar disorder and anxiety who follows in clinic for left-sided headaches.  At her last visit, brain MRI was ordered. Referral to neck PT was placed. She was using Zomig daily and was counseled on medication overuse headache. Emgality was started for migraine prevention.  Interval History: Headaches have improved significantly since her last visit. She has not had any migraines since reducing Zomig use and starting Emgality. Headaches are less severe now and typically improve with Tylenol and aspirin. Zomig does help as needed for migraine rescue. She went to neck PT which she did find helpful.  Brain MRI 01/25/22 showed mild generalized cortical atrophy and minimal microvascular ischemic changes.  5 months ago she began to develop numbness and tingling in her feet. Feels like there is plastic wrap around her feet and ankles.  Recent TSH in February was normal.  Migraine days per month: 0 Migraine free days per month: 30  Current Headache Regimen: Preventative: Emgality 120 mg monthly Abortive: Zomig nasal spray 5 mg PRN   Prior Therapies                                  Topamax - cognitive changes Depakote - drowsiness Gabapentin - mood changes Nadolol 20 mg daily Emgality Zomig nasal spray 5 mg PRN Amerge 1 mg PRN Imitrex ibuprofen  Physical Exam:   Vital Signs: BP 133/86   Pulse 65   Ht '5\' 3"'$  (1.6 m)   Wt 113 lb (51.3 kg)   BMI 20.02 kg/m  GENERAL:  well appearing, in no acute distress, alert  SKIN:  Color, texture, turgor normal. No rashes or lesions HEAD:  Normocephalic/atraumatic. RESP: normal respiratory effort MSK:  No gross joint deformities.   NEUROLOGICAL: Mental Status: Alert, oriented to person, place and time, Follows commands, and Speech fluent and appropriate. Cranial Nerves: PERRL, face symmetric, no dysarthria, hearing grossly  intact Motor: moves all extremities equally. Tremor present in bilateral hands Sensation: Decreased sensation to pinprick in bilateral hands, hyperalgesia in bilateral feet. Decreased sensation to vibration in bilateral feet. Mildly diminished proprioception in bilateral feet. Gait: normal-based.  IMPRESSION: 71 year old female with a history of bipolar disorder and anxiety who follows in clinic for headaches. Her headache have improved significantly with decreased Zomig use and initiation of Emgality. Will continue current management for now. She reports new concerns with numbness in her feet today. Exam is suggestive of peripheral neuropathy. Will check neuropathy labs today and order EMG.  PLAN: -Continue Emgality 120 mg monthly for headache prevention -Continue Zomig nasal spray 5 mg PRN for rescue -Neuropathy labs: A1c, B12, myeloma panel -EMG to assess for neuropathy  Follow-up: 6 months  I spent a total of 32 minutes on the date of the service. Headache education was done. Discussed treatment options including preventive and acute medications. Discussed medication overuse headache and to limit use of acute treatments to no more than 2 days/week or 10 days/month. Discussed medication side effects, adverse reactions and drug interactions. Written educational materials and patient instructions outlining all of the above were given.  Genia Harold, MD 04/28/22 11:36 AM

## 2022-04-29 ENCOUNTER — Encounter: Payer: Self-pay | Admitting: Psychiatry

## 2022-05-03 ENCOUNTER — Telehealth: Payer: Self-pay | Admitting: Psychiatry

## 2022-05-03 NOTE — Telephone Encounter (Signed)
Order for NCV.EMG sent to [facility] [phone].

## 2022-05-04 LAB — MULTIPLE MYELOMA PANEL, SERUM
Albumin SerPl Elph-Mcnc: 4 g/dL (ref 2.9–4.4)
Albumin/Glob SerPl: 1.7 (ref 0.7–1.7)
Alpha 1: 0.2 g/dL (ref 0.0–0.4)
Alpha2 Glob SerPl Elph-Mcnc: 0.6 g/dL (ref 0.4–1.0)
B-Globulin SerPl Elph-Mcnc: 0.9 g/dL (ref 0.7–1.3)
Gamma Glob SerPl Elph-Mcnc: 0.7 g/dL (ref 0.4–1.8)
Globulin, Total: 2.5 g/dL (ref 2.2–3.9)
IgA/Immunoglobulin A, Serum: 119 mg/dL (ref 87–352)
IgG (Immunoglobin G), Serum: 697 mg/dL (ref 586–1602)
IgM (Immunoglobulin M), Srm: 65 mg/dL (ref 26–217)
Total Protein: 6.5 g/dL (ref 6.0–8.5)

## 2022-05-04 LAB — VITAMIN B12: Vitamin B-12: 1342 pg/mL — ABNORMAL HIGH (ref 232–1245)

## 2022-05-04 LAB — HEMOGLOBIN A1C
Est. average glucose Bld gHb Est-mCnc: 97 mg/dL
Hgb A1c MFr Bld: 5 % (ref 4.8–5.6)

## 2022-05-07 IMAGING — NM NM PARATHYROID W/ SPECT
1 series · 6 of 6 positions shown · non-contrast
Comparison: None

CLINICAL DATA: Hyperparathyroidism.  Calcium equal 11.1.

EXAM:
NM PARATHYROID SCINTIGRAPHY AND SPECT IMAGING
TECHNIQUE: Following intravenous administration of radiopharmaceutical, early
and 2-hour delayed planar images were obtained in the anterior
projection. Delayed triplanar SPECT images were also obtained at 2
hours.
RADIOPHARMACEUTICALS:  23.0 mCi Mc-SSm Sestamibi IV

[Series 2: spect parathyroid · 4.14mm/px · 6 of 64 frames shown]
[frame 6/64  full-range]
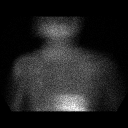
[frame 16/64  full-range]
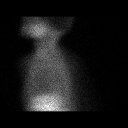
[frame 27/64  full-range]
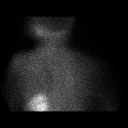
[frame 38/64  full-range]
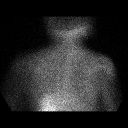
[frame 48/64  full-range]
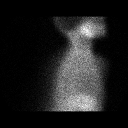
[frame 59/64  full-range]
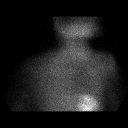

[6 of 6 positions shown; findings below may reference images not displayed]

FINDINGS: Normal washout of radiotracer from the thyroid gland at 2 hour
imaging. No focality identified on planar imaging.

SPECT imaging of the neck at 2 hours fails to localize parathyroid
adenoma within the thyroid bed or chest.
IMPRESSION: No evidence of parathyroid adenoma by planar imaging or SPECT
imaging.

## 2022-05-23 DIAGNOSIS — K1379 Other lesions of oral mucosa: Secondary | ICD-10-CM | POA: Diagnosis not present

## 2022-05-24 ENCOUNTER — Encounter: Payer: Self-pay | Admitting: Psychiatry

## 2022-05-24 NOTE — Telephone Encounter (Signed)
Received a fax that [facility] has been unable to reach the patient

## 2022-06-07 ENCOUNTER — Ambulatory Visit: Payer: Medicare PPO | Admitting: Psychiatry

## 2022-07-01 DIAGNOSIS — Z79899 Other long term (current) drug therapy: Secondary | ICD-10-CM | POA: Diagnosis not present

## 2022-07-01 DIAGNOSIS — E210 Primary hyperparathyroidism: Secondary | ICD-10-CM | POA: Diagnosis not present

## 2022-07-01 DIAGNOSIS — Z5181 Encounter for therapeutic drug level monitoring: Secondary | ICD-10-CM | POA: Diagnosis not present

## 2022-07-01 DIAGNOSIS — F3160 Bipolar disorder, current episode mixed, unspecified: Secondary | ICD-10-CM | POA: Diagnosis not present

## 2022-07-01 DIAGNOSIS — M810 Age-related osteoporosis without current pathological fracture: Secondary | ICD-10-CM | POA: Diagnosis not present

## 2022-07-01 DIAGNOSIS — G43009 Migraine without aura, not intractable, without status migrainosus: Secondary | ICD-10-CM | POA: Diagnosis not present

## 2022-07-01 DIAGNOSIS — F410 Panic disorder [episodic paroxysmal anxiety] without agoraphobia: Secondary | ICD-10-CM | POA: Diagnosis not present

## 2022-07-01 DIAGNOSIS — B370 Candidal stomatitis: Secondary | ICD-10-CM | POA: Diagnosis not present

## 2022-07-21 DIAGNOSIS — M810 Age-related osteoporosis without current pathological fracture: Secondary | ICD-10-CM | POA: Diagnosis not present

## 2022-08-19 DIAGNOSIS — R7889 Finding of other specified substances, not normally found in blood: Secondary | ICD-10-CM | POA: Diagnosis not present

## 2022-08-19 DIAGNOSIS — Z5181 Encounter for therapeutic drug level monitoring: Secondary | ICD-10-CM | POA: Diagnosis not present

## 2022-08-30 DIAGNOSIS — L821 Other seborrheic keratosis: Secondary | ICD-10-CM | POA: Diagnosis not present

## 2022-08-30 DIAGNOSIS — L820 Inflamed seborrheic keratosis: Secondary | ICD-10-CM | POA: Diagnosis not present

## 2022-08-30 DIAGNOSIS — L710 Perioral dermatitis: Secondary | ICD-10-CM | POA: Diagnosis not present

## 2022-08-30 DIAGNOSIS — L570 Actinic keratosis: Secondary | ICD-10-CM | POA: Diagnosis not present

## 2022-08-30 DIAGNOSIS — K130 Diseases of lips: Secondary | ICD-10-CM | POA: Diagnosis not present

## 2022-08-30 DIAGNOSIS — Z85828 Personal history of other malignant neoplasm of skin: Secondary | ICD-10-CM | POA: Diagnosis not present

## 2022-09-01 DIAGNOSIS — M810 Age-related osteoporosis without current pathological fracture: Secondary | ICD-10-CM | POA: Diagnosis not present

## 2022-09-01 DIAGNOSIS — T50905A Adverse effect of unspecified drugs, medicaments and biological substances, initial encounter: Secondary | ICD-10-CM | POA: Diagnosis not present

## 2022-09-18 ENCOUNTER — Encounter: Payer: Self-pay | Admitting: Psychiatry

## 2022-09-24 ENCOUNTER — Telehealth: Payer: Self-pay | Admitting: Internal Medicine

## 2022-09-24 NOTE — Telephone Encounter (Signed)
Julia Lee [phone]  Julia Lee called to say her ears feel plugged up and sound like they have water in them, the right one is worse than the left, and they have been this way for a week, she also woke up with a headache today. So she wants to wait until Monday morning to come in, because of the headache. She has no other symptoms. I scheduled her for Monday morning, I did also tell her if she developed any other symptoms over the weekend she would need a COVID test and to call me first thing Monday morning.

## 2022-09-27 ENCOUNTER — Ambulatory Visit: Payer: Medicare PPO | Admitting: Internal Medicine

## 2022-09-27 ENCOUNTER — Encounter: Payer: Self-pay | Admitting: Internal Medicine

## 2022-09-27 VITALS — BP 130/80 | HR 76 | Temp 99.0°F | Ht 63.0 in | Wt 128.0 lb

## 2022-09-27 DIAGNOSIS — H900 Conductive hearing loss, bilateral: Secondary | ICD-10-CM

## 2022-09-27 DIAGNOSIS — F3170 Bipolar disorder, currently in remission, most recent episode unspecified: Secondary | ICD-10-CM

## 2022-09-27 DIAGNOSIS — H6500 Acute serous otitis media, unspecified ear: Secondary | ICD-10-CM

## 2022-09-27 DIAGNOSIS — H9209 Otalgia, unspecified ear: Secondary | ICD-10-CM | POA: Diagnosis not present

## 2022-09-27 MED ORDER — AZITHROMYCIN 250 MG PO TABS: ORAL_TABLET | ORAL | 0 refills | Status: AC

## 2022-09-27 MED ORDER — METHYLPREDNISOLONE ACETATE 80 MG/ML IJ SUSP
80.0000 mg | Freq: Once | INTRAMUSCULAR | Status: AC
  Administered 2022-09-27: 80 mg via INTRAMUSCULAR

## 2022-09-27 NOTE — Patient Instructions (Addendum)
Depomedrol 80 mg IM  given in office and take Zithromax Zpak 2 tabs day 1 followed by one tab days 2-5. Recheck hearing in about 4 weeks.  May take Diflucan if needed for Candida vaginitis.

## 2022-09-27 NOTE — Progress Notes (Signed)
   Subjective:    Patient ID: Julia Lee, female    DOB: 1951-09-30, 71 y.o.   MRN: [id number]  HPI 71 year old Female seen today with ear congestion and fullness right greater than left for just over a week. She called December 15 but declined appt that day because of a headache. Has hx of migraine headaches. Has flu vaccine in October. Has not had recent Covid booster.  Hx of hypercalcemia followed at [facility] [place] in [place].Hx of osteoporosis.Reclast has been recommended.Hypercalcemia is mild and thought to be due to longstanding lithium use for hx of bipolar disorder. PTH in Nov22, 2023 was 104.3  Review of Systems     Objective:   Physical Exam  VS reviewed. Has fullness both TMs left greater than right. Does not hear all decibels at '40Hz'$  bilaterally. She may have some mild hearing deficit which is currently worsened by OM. Pharynx is clear. Chest is clear       Assessment & Plan:   Acute bilateral serous otitis media- left ear worse than right  Mild conductive hearing loss bilaterally.  History of hypercalcemia followed at [facility] end[facility] [place]-thought to be due to lithium  History of bipolar disorder  History of osteoporosis treated with Reclast    Plan: Patient will be treated with Zithromax Z-PAK 2 tabs day 1 followed by 1 tab days 2 through 5.  Depo-Medrol 80 mg IM for ear congestion/serous otitis media.  Patient is to call if not better in 1 week or sooner if worse.  Suggest she return in  4 weeks for repeat hearing test once ear congestion has cleared up.  May take Diflucan if needed for Candida vaginitis.

## 2022-10-27 ENCOUNTER — Ambulatory Visit: Payer: Medicare PPO | Admitting: Psychiatry

## 2022-11-23 DIAGNOSIS — M810 Age-related osteoporosis without current pathological fracture: Secondary | ICD-10-CM | POA: Diagnosis not present

## 2022-11-30 ENCOUNTER — Ambulatory Visit: Payer: Medicare PPO | Admitting: Psychiatry

## 2022-11-30 ENCOUNTER — Encounter: Payer: Self-pay | Admitting: Psychiatry

## 2022-11-30 VITALS — BP 140/81 | HR 74 | Ht 63.0 in | Wt 129.6 lb

## 2022-11-30 DIAGNOSIS — G43019 Migraine without aura, intractable, without status migrainosus: Secondary | ICD-10-CM

## 2022-11-30 NOTE — Progress Notes (Signed)
   CC:  headaches  Follow-up Visit  Last visit: 04/28/22  Brief HPI: 72 year old female with a history of bipolar disorder and anxiety who follows in clinic for left-sided headaches. MRI brain 01/24/22 mild generalized cortical atrophy and minimal microvascular ischemic changes.   At her last visit she was continued on Emgality and Zomig for migraines. EMG was ordered to assess for neuropathy.  Interval History: Headaches have been well-controlled since her last visit. She stopped Emgality and has not had any migraines for the past 5 months. Zomig was too expensive so she was switched to Imitrex. Has not needed Imitrex for several months, and will usually take ibuprofen for rescue if she has a mild headache. Her hyperparathyroidism and hypercalcemia is also better controlled, which she thinks has helped her headaches.  EMG was not done as her feet paresthesias improved without treatment.  Migraine days per month: 0 Headache free days per month: 30  Current Headache Regimen: Preventative: none Abortive: Zomig   Prior Therapies                                  Preventive: Topamax - cognitive changes Depakote - drowsiness Gabapentin - mood changes Nadolol 20 mg daily Emgality  Rescue: Zomig nasal spray 5 mg PRN Amerge 1 mg PRN Imitrex ibuprofen  Physical Exam:   Vital Signs: BP (!) 140/81 (BP Location: Left Arm, Patient Position: Sitting, Cuff Size: Normal)   Pulse 74   Ht 5' 3"$  (1.6 m)   Wt 129 lb 9.6 oz (58.8 kg)   BMI 22.96 kg/m  GENERAL:  well appearing, in no acute distress, alert  SKIN:  Color, texture, turgor normal. No rashes or lesions HEAD:  Normocephalic/atraumatic. RESP: normal respiratory effort MSK:  No gross joint deformities.   NEUROLOGICAL: Mental Status: Alert, oriented to person, place and time, Follows commands, and Speech fluent and appropriate. Cranial Nerves: PERRL, face symmetric, no dysarthria, hearing grossly intact Motor: moves all  extremities equally Gait: normal-based.  IMPRESSION: 72 year old female with a history of bipolar disorder and anxiety who presents for follow up of migraines. Her headaches have been well-controlled and she has been able to stop Emgality. Will continue Imitrex as needed for rescue. Paresthesias in her feet have resolved, discussed possible EMG in the future if symptoms reoccur.  PLAN: -Rescue: Continue Imitrex 20 mg nasal spray -Next steps: consider EMG if paresthesias reoccur   Follow-up: 1 year, or sooner if needed  I spent a total of 23 minutes on the date of the service. Headache education was done. Discussed treatment options including preventive and acute medications. Discussed medication overuse headache and to limit use of acute treatments to no more than 2 days/week or 10 days/month. Discussed medication side effects, adverse reactions and drug interactions. Written educational materials and patient instructions outlining all of the above were given.  Genia Harold, MD 11/30/22 1:29 PM

## 2022-12-01 DIAGNOSIS — Z5181 Encounter for therapeutic drug level monitoring: Secondary | ICD-10-CM | POA: Diagnosis not present

## 2022-12-01 DIAGNOSIS — R7889 Finding of other specified substances, not normally found in blood: Secondary | ICD-10-CM | POA: Diagnosis not present

## 2023-01-13 ENCOUNTER — Telehealth: Payer: Self-pay | Admitting: Internal Medicine

## 2023-01-13 NOTE — Telephone Encounter (Signed)
I called patient get clarification regarding her request for ENT referral today.  She indicates that she had a hearing test done at [facility] today and has in her possession a copy of that report.  She says she was told that her eardrum was worse on the right than the left.  It is not clear to me what this means.  She was last seen here in December and was diagnosed with acute serous otitis media (bilateral).  We conducted an audio scope to hearing test at that time, she missed hearing all decibels at 40 Hz bilaterally.  It was felt that she might have some mild hearing deficit made worse by otitis media.  Patient was treated with Zithromax and Depo-Medrol and was advised to call if not improved and we also suggested at that time that she have repeat hearing testing in 4 weeks once ear congestion has cleared up but she never followed up with that.  I have advised the patient that she needs to be seen here for evaluation to get referral to ENT that [facility] saw her.  She will also bring me a copy of that report from [facility].  Have booked appointment for April night at 2:30 PM.  MJB, MD

## 2023-01-13 NOTE — Telephone Encounter (Addendum)
Julia Lee [phone]  [name] called to say she has had a hearing test and they suggested she be seen by ENT before getting hearing aids. She said they could not do a referral and suggested she contact her PCP. She also said she is still having trouble with ears feeling clogged and blocked since she was last hear. That the medication did not help. Last OV was 09/27/2022. Does she need to come in and be seen, I did also ask her to try and have hearing test results fax to [place], that it would be helpful to see what they had found, she said they had found some kind of blockage in both ears.

## 2023-01-18 ENCOUNTER — Ambulatory Visit: Payer: Medicare PPO | Admitting: Internal Medicine

## 2023-01-18 ENCOUNTER — Encounter: Payer: Self-pay | Admitting: Internal Medicine

## 2023-01-18 VITALS — BP 112/72 | HR 67 | Temp 97.8°F | Ht 63.0 in | Wt 132.8 lb

## 2023-01-18 DIAGNOSIS — H903 Sensorineural hearing loss, bilateral: Secondary | ICD-10-CM

## 2023-01-18 DIAGNOSIS — F3170 Bipolar disorder, currently in remission, most recent episode unspecified: Secondary | ICD-10-CM | POA: Diagnosis not present

## 2023-01-18 NOTE — Progress Notes (Signed)
Patient Care Team: Margaree Mackintosh, MD as PCP - General (Internal Medicine)  Visit Date: 01/18/23  Subjective:    Patient ID: Julia Lee , Female   DOB: 1951/04/01, 72 y.o.    MRN: [id number]   72 y.o. Female presents today for hearing loss that is worse on the right side. Had a hearing test at [facility] recently. This has been an ongoing issue since 09/27/22.  Past Medical History:  Diagnosis Date   Anxiety    Bipolar disorder    Cancer    rt arm squamous cell ca   History of migraine headaches    History of recurrent UTIs    Migraines    Osteoporosis      Family History  Problem Relation Age of Onset   Pancreatic cancer Mother    Heart disease Father    Suicidality Sister    Breast cancer Maternal Grandmother    Diabetes Maternal Grandmother    Colon cancer Neg Hx    Colon polyps Neg Hx    Esophageal cancer Neg Hx    Stomach cancer Neg Hx    Rectal cancer Neg Hx    Thyroid disease Neg Hx     Social History   Social History Narrative   Left handed   Caffeine use: 2 cups per day      Review of Systems  Constitutional:  Negative for fever and malaise/fatigue.  HENT:  Positive for hearing loss (Worse on right). Negative for congestion.   Eyes:  Negative for blurred vision.  Respiratory:  Negative for cough and shortness of breath.   Cardiovascular:  Negative for chest pain, palpitations and leg swelling.  Gastrointestinal:  Negative for vomiting.  Musculoskeletal:  Negative for back pain.  Skin:  Negative for rash.  Neurological:  Negative for loss of consciousness and headaches.        Objective:   Vitals: BP 112/72   Pulse 67   Temp 97.8 F (36.6 C) (Tympanic)   Ht 5\' 3"  (1.6 m)   Wt 132 lb 12.8 oz (60.2 kg)   SpO2 100%   BMI 23.52 kg/m    Physical Exam Vitals and nursing note reviewed.  Constitutional:      General: She is not in acute distress.    Appearance: Normal appearance. She is not toxic-appearing.  HENT:     Head:  Normocephalic and atraumatic.     Right Ear: Ear canal and external ear normal.     Left Ear: Ear canal and external ear normal.     Ears:     Comments: Left TM is full. 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 Pulmonary:     Effort: Pulmonary effort is normal.  Skin:    General: Skin is warm and dry.  Neurological:     Mental Status: She is alert and oriented to person, place, and time. Mental status is at baseline.  Psychiatric:        Mood and Affect: Mood normal.        Behavior: Behavior normal.        Thought Content: Thought content normal.        Judgment: Judgment normal.       Results:   Studies obtained and personally reviewed by me:   Labs:       Component Value Date/Time   NA 136 12/24/2021 1541   K 5.3 12/24/2021 1541   CL 104 12/24/2021 1541   CO2 24  12/24/2021 1541   GLUCOSE 104 (H) 12/24/2021 1541   BUN 21 12/24/2021 1541   CREATININE 0.99 12/24/2021 1541   CALCIUM 11.1 (H) 12/24/2021 1541   PROT 6.5 04/28/2022 1138   ALBUMIN 4.7 11/13/2021 1027   AST 11 12/24/2021 1541   ALT 11 12/24/2021 1541   ALKPHOS 26 (L) 11/13/2021 1027   BILITOT 0.4 12/24/2021 1541   GFRNONAA 65 02/17/2021 0941   GFRAA 76 02/17/2021 0941     Lab Results  Component Value Date   WBC 7.5 11/13/2021   HGB 14.5 11/13/2021   HCT 43.3 11/13/2021   MCV 97.7 11/13/2021   PLT 301.0 11/13/2021    Lab Results  Component Value Date   CHOL 182 03/12/2022   HDL 75 03/12/2022   LDLCALC 91 03/12/2022   TRIG 73 03/12/2022   CHOLHDL 2.4 03/12/2022    Lab Results  Component Value Date   HGBA1C 5.0 04/28/2022     Lab Results  Component Value Date   TSH 1.54 11/13/2021      Assessment & Plan:   Hearing loss: [facility]    I,Alexander Ruley,acting as a scribe for Margaree MackintoshMary J Tulani Kidney, MD.,have documented all relevant documentation on the behalf of Margaree MackintoshMary J Leba Tibbitts, MD,as directed by  Margaree MackintoshMary J Eldean Nanna, MD while in the  presence of Margaree MackintoshMary J Dian Minahan, MD.   I, Margaree MackintoshMary J Billiejo Sorto, MD, have reviewed all documentation for this visit. The documentation on 02/02/23 for the exam, diagnosis, procedures, and orders are all accurate and complete.

## 2023-02-02 DIAGNOSIS — H9190 Unspecified hearing loss, unspecified ear: Secondary | ICD-10-CM | POA: Insufficient documentation

## 2023-02-18 ENCOUNTER — Encounter: Payer: Self-pay | Admitting: Internal Medicine

## 2023-02-21 DIAGNOSIS — R7889 Finding of other specified substances, not normally found in blood: Secondary | ICD-10-CM | POA: Diagnosis not present

## 2023-02-21 DIAGNOSIS — Z5181 Encounter for therapeutic drug level monitoring: Secondary | ICD-10-CM | POA: Diagnosis not present

## 2023-03-09 DIAGNOSIS — L820 Inflamed seborrheic keratosis: Secondary | ICD-10-CM | POA: Diagnosis not present

## 2023-03-09 DIAGNOSIS — Z85828 Personal history of other malignant neoplasm of skin: Secondary | ICD-10-CM | POA: Diagnosis not present

## 2023-03-09 DIAGNOSIS — L570 Actinic keratosis: Secondary | ICD-10-CM | POA: Diagnosis not present

## 2023-03-09 DIAGNOSIS — L821 Other seborrheic keratosis: Secondary | ICD-10-CM | POA: Diagnosis not present

## 2023-03-09 DIAGNOSIS — L853 Xerosis cutis: Secondary | ICD-10-CM | POA: Diagnosis not present

## 2023-03-14 NOTE — Progress Notes (Shared)
Annual Wellness Visit    Patient Care Team: Margaree Mackintosh, MD as PCP - General (Internal Medicine)  Visit Date: 03/14/23   No chief complaint on file.   Subjective:   Patient: Julia Lee, Female    DOB: Jan 09, 1951, 72 y.o.   MRN: [id number]  Julia Lee is a 72 y.o. Female who presents today for her Annual Wellness Visit.  History of anxiety and bipolar disorder treated with clonazepam 0.125 mg three times daily, lithium carbonate 300 mg twice daily with a meal.  History of migraine headaches   Has seen Victorino Dike Chima,M.D. at [facility] for migraine headaches and is doing better.Is on Emgality injection monthly per Neurologist.   Now seen by Dr. Tresa Endo, Endocrinologist in [place].  Has history of hypercalcemia.   Dorrene German is her new Psychiatrist and lithium dose is 300 mg twice daily currently.   Having IT band issues left leg and is going to PT   Has T score in distal radius -3.3 consistent with osteoporosis.  Needs to address this with her new endocrinologist in [facility].   Had colonoscopy in 2019 with Dr. Adela Lank.  Colonoscopy was normal and 10-year follow-up was recommended.  She also had upper endoscopy in February 2023 by Dr. Adela Lank with complaint of epigastric pain and weight loss.  Biopsies of small intestine were normal.  She and her husband want to spend time in [place] and they hope to be able to travel soon.  She is feeling better from a mental aspect and a physical aspect.  Mammogram last completed 04/07/22. No mammographic evidence of malignancy. Recommended repeat in 2024.     Social history: She is married.  Husband is a Community education officer at [employer].  1 adult daughter and 1 grandchild.  Patient does not smoke.  Seldom consumes alcohol.    Vaccine counseling: UTD on flu, Covid-19, tetanus, shingles, pneumococcal 20 vaccines.  Past Medical History:  Diagnosis Date   Anxiety    Bipolar disorder  (HCC)    Cancer (HCC)    rt arm squamous cell ca   History of migraine headaches    History of recurrent UTIs    Migraines    Osteoporosis      Family History  Problem Relation Age of Onset   Pancreatic cancer Mother    Heart disease Father    Suicidality Sister    Breast cancer Maternal Grandmother    Diabetes Maternal Grandmother    Colon cancer Neg Hx    Colon polyps Neg Hx    Esophageal cancer Neg Hx    Stomach cancer Neg Hx    Rectal cancer Neg Hx    Thyroid disease Neg Hx      Social History   Social History Narrative   Left handed   Caffeine use: 2 cups per day     ROS    Objective:   Vitals: There were no vitals taken for this visit.  Physical Exam   Most recent functional status assessment:    03/16/2022    3:11 PM  In your present state of health, do you have any difficulty performing the following activities:  Hearing? 0  Vision? 0  Difficulty concentrating or making decisions? 0  Walking or climbing stairs? 0  Dressing or bathing? 0  Doing errands, shopping? 0  Preparing Food and eating ? N  Using the Toilet? N  In the past six months, have you accidently leaked urine? N  Do you have problems with loss of bowel control? N  Managing your Medications? N  Managing your Finances? N  Housekeeping or managing your Housekeeping? N   Most recent fall risk assessment:    01/18/2023    2:36 PM  Fall Risk   Falls in the past year? 0  Number falls in past yr: 0  Risk for fall due to : No Fall Risks  Follow up Falls prevention discussed    Most recent depression screenings:    01/18/2023    2:36 PM 09/27/2022    9:47 AM  PHQ 2/9 Scores  PHQ - 2 Score 0 0   Most recent cognitive screening:    03/16/2022    3:12 PM  6CIT Screen  What Year? 0 points  What month? 0 points  What time? 0 points  Count back from 20 0 points  Months in reverse 0 points     Results:   Studies obtained and personally reviewed by me:  Imaging, colonoscopy,  mammogram, bone density scan, echocardiogram, heart cath, stress test, CT calcium score, etc. ***   Labs:       Component Value Date/Time   NA 136 12/24/2021 1541   K 5.3 12/24/2021 1541   CL 104 12/24/2021 1541   CO2 24 12/24/2021 1541   GLUCOSE 104 (H) 12/24/2021 1541   BUN 21 12/24/2021 1541   CREATININE 0.99 12/24/2021 1541   CALCIUM 11.1 (H) 12/24/2021 1541   PROT 6.5 04/28/2022 1138   ALBUMIN 4.7 11/13/2021 1027   AST 11 12/24/2021 1541   ALT 11 12/24/2021 1541   ALKPHOS 26 (L) 11/13/2021 1027   BILITOT 0.4 12/24/2021 1541   GFRNONAA 65 02/17/2021 0941   GFRAA 76 02/17/2021 0941     Lab Results  Component Value Date   WBC 7.5 11/13/2021   HGB 14.5 11/13/2021   HCT 43.3 11/13/2021   MCV 97.7 11/13/2021   PLT 301.0 11/13/2021    Lab Results  Component Value Date   CHOL 182 03/12/2022   HDL 75 03/12/2022   LDLCALC 91 03/12/2022   TRIG 73 03/12/2022   CHOLHDL 2.4 03/12/2022    Lab Results  Component Value Date   HGBA1C 5.0 04/28/2022     Lab Results  Component Value Date   TSH 1.54 11/13/2021     No results found for: "PSA1", "PSA" *** delete for female pts  ***  Assessment & Plan:   ***      Annual wellness visit done today including the all of the following: Reviewed patient's Family Medical History Reviewed and updated list of patient's medical providers Assessment of cognitive impairment was done Assessed patient's functional ability Established a written schedule for health screening services Health Risk Assessent Completed and Reviewed  Discussed health benefits of physical activity, and encouraged her to engage in regular exercise appropriate for her age and condition.        I,Alexander Ruley,acting as a Neurosurgeon for Margaree Mackintosh, MD.,have documented all relevant documentation on the behalf of Margaree Mackintosh, MD,as directed by  Margaree Mackintosh, MD while in the presence of Margaree Mackintosh, MD.   ***

## 2023-03-15 ENCOUNTER — Encounter: Payer: Self-pay | Admitting: Internal Medicine

## 2023-03-17 ENCOUNTER — Other Ambulatory Visit: Payer: Medicare PPO

## 2023-03-21 ENCOUNTER — Ambulatory Visit: Payer: Medicare PPO | Admitting: Internal Medicine

## 2023-03-24 DIAGNOSIS — H90A21 Sensorineural hearing loss, unilateral, right ear, with restricted hearing on the contralateral side: Secondary | ICD-10-CM | POA: Diagnosis not present

## 2023-03-24 DIAGNOSIS — H903 Sensorineural hearing loss, bilateral: Secondary | ICD-10-CM | POA: Diagnosis not present

## 2023-05-20 DIAGNOSIS — U071 COVID-19: Secondary | ICD-10-CM | POA: Diagnosis not present

## 2023-06-20 DIAGNOSIS — M25551 Pain in right hip: Secondary | ICD-10-CM | POA: Diagnosis not present

## 2023-06-20 DIAGNOSIS — M7062 Trochanteric bursitis, left hip: Secondary | ICD-10-CM | POA: Diagnosis not present

## 2023-06-20 DIAGNOSIS — G5701 Lesion of sciatic nerve, right lower limb: Secondary | ICD-10-CM | POA: Diagnosis not present

## 2023-06-20 DIAGNOSIS — M25552 Pain in left hip: Secondary | ICD-10-CM | POA: Diagnosis not present

## 2023-06-27 DIAGNOSIS — M25551 Pain in right hip: Secondary | ICD-10-CM | POA: Diagnosis not present

## 2023-06-27 DIAGNOSIS — M25552 Pain in left hip: Secondary | ICD-10-CM | POA: Diagnosis not present

## 2023-06-27 DIAGNOSIS — M7062 Trochanteric bursitis, left hip: Secondary | ICD-10-CM | POA: Diagnosis not present

## 2023-06-27 DIAGNOSIS — G5701 Lesion of sciatic nerve, right lower limb: Secondary | ICD-10-CM | POA: Diagnosis not present

## 2023-07-04 DIAGNOSIS — M7062 Trochanteric bursitis, left hip: Secondary | ICD-10-CM | POA: Diagnosis not present

## 2023-07-04 DIAGNOSIS — M25551 Pain in right hip: Secondary | ICD-10-CM | POA: Diagnosis not present

## 2023-07-04 DIAGNOSIS — M25552 Pain in left hip: Secondary | ICD-10-CM | POA: Diagnosis not present

## 2023-07-04 DIAGNOSIS — G5701 Lesion of sciatic nerve, right lower limb: Secondary | ICD-10-CM | POA: Diagnosis not present

## 2023-07-12 DIAGNOSIS — M7062 Trochanteric bursitis, left hip: Secondary | ICD-10-CM | POA: Diagnosis not present

## 2023-07-12 DIAGNOSIS — M25551 Pain in right hip: Secondary | ICD-10-CM | POA: Diagnosis not present

## 2023-07-12 DIAGNOSIS — M25552 Pain in left hip: Secondary | ICD-10-CM | POA: Diagnosis not present

## 2023-07-12 DIAGNOSIS — G5701 Lesion of sciatic nerve, right lower limb: Secondary | ICD-10-CM | POA: Diagnosis not present

## 2023-07-20 DIAGNOSIS — M25552 Pain in left hip: Secondary | ICD-10-CM | POA: Diagnosis not present

## 2023-07-20 DIAGNOSIS — G5701 Lesion of sciatic nerve, right lower limb: Secondary | ICD-10-CM | POA: Diagnosis not present

## 2023-07-20 DIAGNOSIS — M7062 Trochanteric bursitis, left hip: Secondary | ICD-10-CM | POA: Diagnosis not present

## 2023-07-20 DIAGNOSIS — M25551 Pain in right hip: Secondary | ICD-10-CM | POA: Diagnosis not present

## 2023-07-21 ENCOUNTER — Other Ambulatory Visit: Payer: Self-pay | Admitting: Internal Medicine

## 2023-07-21 DIAGNOSIS — Z1231 Encounter for screening mammogram for malignant neoplasm of breast: Secondary | ICD-10-CM

## 2023-07-26 DIAGNOSIS — M7062 Trochanteric bursitis, left hip: Secondary | ICD-10-CM | POA: Diagnosis not present

## 2023-07-26 DIAGNOSIS — G5701 Lesion of sciatic nerve, right lower limb: Secondary | ICD-10-CM | POA: Diagnosis not present

## 2023-07-26 DIAGNOSIS — M25551 Pain in right hip: Secondary | ICD-10-CM | POA: Diagnosis not present

## 2023-07-26 DIAGNOSIS — M25552 Pain in left hip: Secondary | ICD-10-CM | POA: Diagnosis not present

## 2023-07-27 DIAGNOSIS — G5701 Lesion of sciatic nerve, right lower limb: Secondary | ICD-10-CM | POA: Diagnosis not present

## 2023-07-27 DIAGNOSIS — M7062 Trochanteric bursitis, left hip: Secondary | ICD-10-CM | POA: Diagnosis not present

## 2023-08-11 DIAGNOSIS — R7889 Finding of other specified substances, not normally found in blood: Secondary | ICD-10-CM | POA: Diagnosis not present

## 2023-08-11 DIAGNOSIS — Z5181 Encounter for therapeutic drug level monitoring: Secondary | ICD-10-CM | POA: Diagnosis not present

## 2023-08-17 ENCOUNTER — Ambulatory Visit: Payer: Medicare PPO | Admitting: Orthopaedic Surgery

## 2023-09-19 DIAGNOSIS — L570 Actinic keratosis: Secondary | ICD-10-CM | POA: Diagnosis not present

## 2023-09-19 DIAGNOSIS — D692 Other nonthrombocytopenic purpura: Secondary | ICD-10-CM | POA: Diagnosis not present

## 2023-09-19 DIAGNOSIS — L738 Other specified follicular disorders: Secondary | ICD-10-CM | POA: Diagnosis not present

## 2023-09-19 DIAGNOSIS — Z85828 Personal history of other malignant neoplasm of skin: Secondary | ICD-10-CM | POA: Diagnosis not present

## 2023-09-19 DIAGNOSIS — L578 Other skin changes due to chronic exposure to nonionizing radiation: Secondary | ICD-10-CM | POA: Diagnosis not present

## 2023-09-23 DIAGNOSIS — H903 Sensorineural hearing loss, bilateral: Secondary | ICD-10-CM | POA: Diagnosis not present

## 2023-10-16 DIAGNOSIS — B9689 Other specified bacterial agents as the cause of diseases classified elsewhere: Secondary | ICD-10-CM | POA: Diagnosis not present

## 2023-10-16 DIAGNOSIS — J208 Acute bronchitis due to other specified organisms: Secondary | ICD-10-CM | POA: Diagnosis not present

## 2023-10-16 DIAGNOSIS — R051 Acute cough: Secondary | ICD-10-CM | POA: Diagnosis not present

## 2023-10-17 ENCOUNTER — Other Ambulatory Visit: Payer: Medicare PPO

## 2023-10-18 ENCOUNTER — Ambulatory Visit: Payer: Medicare PPO | Admitting: Internal Medicine

## 2023-10-24 ENCOUNTER — Ambulatory Visit: Payer: Medicare PPO | Admitting: Internal Medicine

## 2023-10-24 VITALS — BP 130/80 | HR 71 | Temp 98.7°F | Ht 63.0 in | Wt 135.0 lb

## 2023-10-24 DIAGNOSIS — L97911 Non-pressure chronic ulcer of unspecified part of right lower leg limited to breakdown of skin: Secondary | ICD-10-CM

## 2023-10-24 DIAGNOSIS — L089 Local infection of the skin and subcutaneous tissue, unspecified: Secondary | ICD-10-CM | POA: Diagnosis not present

## 2023-10-24 DIAGNOSIS — S8011XA Contusion of right lower leg, initial encounter: Secondary | ICD-10-CM | POA: Diagnosis not present

## 2023-10-24 MED ORDER — DOXYCYCLINE HYCLATE 100 MG PO TABS: 100.0000 mg | ORAL_TABLET | Freq: Two times a day (BID) | ORAL | 0 refills | Status: DC

## 2023-10-24 MED ORDER — MUPIROCIN 2 % EX OINT: 1.0000 | TOPICAL_OINTMENT | Freq: Two times a day (BID) | CUTANEOUS | 0 refills | Status: DC

## 2023-10-24 NOTE — Patient Instructions (Signed)
 Clean the wound DAILY with warm soapy water, dry the wound, and apply small amount of mupirocin . Cover with Telfa dressing. Sending in Doxycyline 100 mg twice daily by mouth for 7 days. Tetanus immunization is up to date. Since the wound has been ongoing since October, [facility].

## 2023-10-24 NOTE — Progress Notes (Signed)
 Patient Care Team: Perri Ronal PARAS, MD as PCP - General (Internal Medicine)  Visit Date: 10/24/23  Subjective:   Chief Complaint  Patient presents with   Leg Injury    Hit her leg on a corner of furniture about 3 months ago. Wound still bleeding and not healing.    Wound Infection   Patient PI:Julia Lee,Female DOB:[birth year],73 y.o. MRN:[id number]   73 y.o. Female presents today for acute visit with a Injury of the Lower Extremity. Reports about 3 months ago she hit her leg on a corner and sustained a cut and contusion on her right leg that has not healed and has started to become painful over the past week. Has been covering wound and applying polysporin. Denies fever.   Past Medical History:  Diagnosis Date   Anxiety    Bipolar disorder (HCC)    Cancer (HCC)    rt arm squamous cell ca   History of migraine headaches    History of recurrent UTIs    Migraines    Osteoporosis     Family History  Problem Relation Age of Onset   Pancreatic cancer Mother    Heart disease Father    Suicidality Sister    Breast cancer Maternal Grandmother    Diabetes Maternal Grandmother    Colon cancer Neg Hx    Colon polyps Neg Hx    Esophageal cancer Neg Hx    Stomach cancer Neg Hx    Rectal cancer Neg Hx    Thyroid  disease Neg Hx    Social History   Social History Narrative   Left handed   Caffeine use: 2 cups per day   ROS   Objective:  Vitals: BP 130/80   Pulse 71   Temp 98.7 F (37.1 C)   Ht 5' 3 (1.6 m)   Wt 135 lb (61.2 kg)   SpO2 99%   BMI 23.91 kg/m   Physical Exam Musculoskeletal:     Comments: Wound right lower extremity mid-anterior calf with surrounding erythema and purulence formation      Results:  Studies Obtained And Personally Reviewed By Me: Labs:     Component Value Date/Time   NA 136 12/24/2021 1541   K 5.3 12/24/2021 1541   CL 104 12/24/2021 1541   CO2 24 12/24/2021 1541   GLUCOSE 104 (H) 12/24/2021 1541   BUN 21 12/24/2021 1541    CREATININE 0.99 12/24/2021 1541   CALCIUM 11.1 (H) 12/24/2021 1541   PROT 6.5 04/28/2022 1138   ALBUMIN 4.7 11/13/2021 1027   AST 11 12/24/2021 1541   ALT 11 12/24/2021 1541   ALKPHOS 26 (L) 11/13/2021 1027   BILITOT 0.4 12/24/2021 1541   GFRNONAA 65 02/17/2021 0941   GFRAA 76 02/17/2021 0941    Lab Results  Component Value Date   WBC 7.5 11/13/2021   HGB 14.5 11/13/2021   HCT 43.3 11/13/2021   MCV 97.7 11/13/2021   PLT 301.0 11/13/2021   Lab Results  Component Value Date   CHOL 182 03/12/2022   HDL 75 03/12/2022   LDLCALC 91 03/12/2022   TRIG 73 03/12/2022   CHOLHDL 2.4 03/12/2022   Lab Results  Component Value Date   HGBA1C 5.0 04/28/2022    Lab Results  Component Value Date   TSH 1.54 11/13/2021   Assessment & Plan:   Injury of the Right Lower Extremity w/ Subsequent Infection: Injury sustained 3 months ago to right anterior calf, not healing, with surrounding erythema and purulence formation.  Sending in 100 mg Doxycycline  - take 1 tablet (100 mg total) by mouth 2 (two) times daily for 7 (seven) days. Re[facility] to [facility] for further evaluation and treatment. Cleaning and redressing instruction provided - clean once daily and apply Mupirocin  ointment.   I,Emily Lagle,acting as a neurosurgeon for Ronal JINNY Hailstone, MD.,have documented all relevant documentation on the behalf of Ronal JINNY Hailstone, MD,as directed by  Ronal JINNY Hailstone, MD while in the presence of Ronal JINNY Hailstone, MD.   I, Ronal JINNY Hailstone, MD, have reviewed all documentation for this visit. The documentation on 10/30/23 for the exam, diagnosis, procedures, and orders are all accurate and complete.

## 2023-10-28 ENCOUNTER — Encounter (HOSPITAL_BASED_OUTPATIENT_CLINIC_OR_DEPARTMENT_OTHER): Payer: Medicare PPO | Attending: Internal Medicine | Admitting: Internal Medicine

## 2023-10-28 DIAGNOSIS — I87311 Chronic venous hypertension (idiopathic) with ulcer of right lower extremity: Secondary | ICD-10-CM | POA: Diagnosis not present

## 2023-10-28 DIAGNOSIS — L97812 Non-pressure chronic ulcer of other part of right lower leg with fat layer exposed: Secondary | ICD-10-CM

## 2023-10-30 ENCOUNTER — Encounter: Payer: Self-pay | Admitting: Internal Medicine

## 2023-10-30 ENCOUNTER — Encounter: Payer: Self-pay | Admitting: Family Medicine

## 2023-10-31 NOTE — Progress Notes (Signed)
Julia Lee ([id number]) [id number] Page 1 of 10 Visit Report for 10/28/2023 Allergy List Details Patient Name: Date of Service: Julia Lee, Julia Lee 10/28/2023 8:15 A M Medical Record Number: [id number] Patient Account Number: [id number] Date of Birth/Sex: Treating RN: 1950/12/05 (73 y.o. Arta Silence Primary Care Aaron Boeh: Marlan Palau Other Clinician: Referring Bates Collington: Treating Rhea Thrun/Extender: Cay Schillings in Treatment: 0 Allergies Active Allergies No Known Drug Allergies Allergy Notes per patient the 4 current medication divalproex, oxycodone, topamax, gabapentin more side effects then allergies per patient. remove from her list. Electronic Signature(s) Signed: 10/31/2023 2:44:18 PM By: Shawn Stall RN, BSN Entered By: Shawn Stall on 10/28/2023 08:33:42 -------------------------------------------------------------------------------- Arrival Information Details Patient Name: Date of Service: Julia Lee 10/28/2023 8:15 A M Medical Record Number: [id number] Patient Account Number: [id number] Date of Birth/Sex: Treating RN: Mar 05, 1951 (73 y.o. Arta Silence Primary Care Trystyn Sitts: Marlan Palau Other Clinician: Referring Danasha Melman: Treating Saray Capasso/Extender: Cay Schillings in Treatment: 0 Visit Information Patient Arrived: Ambulatory Arrival Time: 08:26 Accompanied By: self Transfer Assistance: None Patient Identification Verified: Yes Secondary Verification Process Completed: Yes Patient Requires Transmission-Based Precautions: No Patient Has Alerts: No Electronic Signature(s) Signed: 10/31/2023 2:44:18 PM By: Shawn Stall RN, BSN Entered By: Shawn Stall on 10/28/2023 08:27:14 -------------------------------------------------------------------------------- Clinic Level of Care Assessment Details Patient Name: Date of Service: Julia Lee 10/28/2023 8:15 A Hermelinda Dellen,  Julia Lee ([id number]) [id number] Page 2 of 10 Medical Record Number: [id number] Patient Account Number: [id number] Date of Birth/Sex: Treating RN: 05-02-1951 (73 y.o. Arta Silence Primary Care Evie Croston: Marlan Palau Other Clinician: Referring Kia Varnadore: Treating Jawanda Passey/Extender: Cay Schillings in Treatment: 0 Clinic Level of Care Assessment Items TOOL 2 Quantity Score X- 1 0 Use when only an EandM is performed on the INITIAL visit ASSESSMENTS - Nursing Assessment / Reassessment X- 1 20 General Physical Exam (combine w/ comprehensive assessment (listed just below) when performed on new pt. evals) X- 1 25 Comprehensive Assessment (HX, ROS, Risk Assessments, Wounds Hx, etc.) ASSESSMENTS - Wound and Skin A ssessment / Reassessment X - Simple Wound Assessment / Reassessment - one wound 1 5 []  - 0 Complex Wound Assessment / Reassessment - multiple wounds X- 1 10 Dermatologic / Skin Assessment (not related to wound area) ASSESSMENTS - Ostomy and/or Continence Assessment and Care []  - 0 Incontinence Assessment and Management []  - 0 Ostomy Care Assessment and Management (repouching, etc.) PROCESS - Coordination of Care X - Simple Patient / Family Education for ongoing care 1 15 []  - 0 Complex (extensive) Patient / Family Education for ongoing care X- 1 10 Staff obtains Chiropractor, Records, T Results / Process Orders est []  - 0 Staff telephones HHA, Nursing Homes / Clarify orders / etc []  - 0 Routine Transfer to another Facility (non-emergent condition) []  - 0 Routine Hospital Admission (non-emergent condition) []  - 0 New Admissions / Manufacturing engineer / Ordering NPWT Apligraf, etc. , []  - 0 Emergency Hospital Admission (emergent condition) X- 1 10 Simple Discharge Coordination []  - 0 Complex (extensive) Discharge Coordination PROCESS - Special Needs []  - 0 Pediatric / Minor Patient Management []  - 0 Isolation  Patient Management []  - 0 Hearing / Language / Visual special needs []  - 0 Assessment of Community assistance (transportation, D/C planning, etc.) []  - 0 Additional assistance / Altered mentation []  - 0 Support Surface(s) Assessment (bed, cushion, seat, etc.) INTERVENTIONS - Wound Cleansing / Measurement X- 1 5 Wound Imaging (photographs - any number of wounds) []  -  0 Wound Tracing (instead of photographs) X- 1 5 Simple Wound Measurement - one wound []  - 0 Complex Wound Measurement - multiple wounds X- 1 5 Simple Wound Cleansing - one wound []  - 0 Complex Wound Cleansing - multiple wounds INTERVENTIONS - Wound Dressings X - Small Wound Dressing one or multiple wounds 1 10 []  - 0 Medium Wound Dressing one or multiple wounds []  - 0 Large Wound Dressing one or multiple wounds Julia Lee, Julia Lee ([id number]) [id number] Page 3 of 10 []  - 0 Application of Medications - injection INTERVENTIONS - Miscellaneous []  - 0 External ear exam []  - 0 Specimen Collection (cultures, biopsies, blood, body fluids, etc.) []  - 0 Specimen(s) / Culture(s) sent or taken to Lab for analysis []  - 0 Patient Transfer (multiple staff / Michiel Sites Lift / Similar devices) []  - 0 Simple Staple / Suture removal (25 or less) []  - 0 Complex Staple / Suture removal (26 or more) []  - 0 Hypo / Hyperglycemic Management (close monitor of Blood Glucose) X- 1 15 Ankle / Brachial Index (ABI) - do not check if billed separately Has the patient been seen at the hospital within the last three years: Yes Total Score: 135 Level Of Care: New/Established - Level 4 Electronic Signature(s) Signed: 10/31/2023 2:44:18 PM By: Shawn Stall RN, BSN Entered By: Shawn Stall on 10/28/2023 09:10:48 -------------------------------------------------------------------------------- Encounter Discharge Information Details Patient Name: Date of Service: Julia Lee 10/28/2023 8:15 A M Medical  Record Number: [id number] Patient Account Number: [id number] Date of Birth/Sex: Treating RN: 16-Sep-1951 (73 y.o. Arta Silence Primary Care Guerline Happ: Marlan Palau Other Clinician: Referring Karanveer Ramakrishnan: Treating Gabriella Woodhead/Extender: Cay Schillings in Treatment: 0 Encounter Discharge Information Items Discharge Condition: Stable Ambulatory Status: Ambulatory Discharge Destination: Home Transportation: Private Auto Accompanied By: self Schedule Follow-up Appointment: Yes Clinical Summary of Care: Electronic Signature(s) Signed: 10/31/2023 2:44:18 PM By: Shawn Stall RN, BSN Entered By: Shawn Stall on 10/28/2023 09:14:50 -------------------------------------------------------------------------------- Lower Extremity Assessment Details Patient Name: Date of Service: TASHAYLA, GRODEN 10/28/2023 8:15 A M Medical Record Number: [id number] Patient Account Number: [id number] Date of Birth/Sex: Treating RN: [birth year] (73 y.o. Arta Silence Primary Care Darean Rote: Marlan Palau Other Clinician: Referring Kinsley Holderman: Treating Angeliah Wisdom/Extender: Cay Schillings in Treatment: 0 Edema Assessment Assessed: [Left: No] [Right: Yes] N[LeftCHERICA, Julia Lee Lee ([id number])] [Right: [id number] Page 4 of 10] Edema: [Left: N] [Right: o] Calf Left: Right: Point of Measurement: 28 cm From Medial Instep 33 cm Ankle Left: Right: Point of Measurement: 10 cm From Medial Instep 19 cm Knee To Floor Left: Right: From Medial Instep 38 cm Vascular Assessment Pulses: Dorsalis Pedis Palpable: [Right:Yes] Doppler Audible: [Right:Yes] Posterior Tibial Palpable: [Right:Yes] Doppler Audible: [Right:Yes] Extremity colors, hair growth, and conditions: Extremity Color: [Right:Normal] Hair Growth on Extremity: [Right:No] Temperature of Extremity: [Right:Warm] Capillary Refill: [Right:< 3 seconds] Dependent Rubor: [Right:No] Blanched when  Elevated: [Right:No] Lipodermatosclerosis: [Right:No] Blood Pressure: Brachial: [Right:146] Ankle: [Right:Dorsalis Pedis: 190 1.30] Toe Nail Assessment Left: Right: Thick: No Discolored: No Deformed: No Improper Length and Hygiene: No Notes multiphasic waveforms. Electronic Signature(s) Signed: 10/31/2023 2:44:18 PM By: Shawn Stall RN, BSN Entered By: Shawn Stall on 10/28/2023 08:52:30 -------------------------------------------------------------------------------- Multi Wound Chart Details Patient Name: Date of Service: Julia Lee 10/28/2023 8:15 A M Medical Record Number: [id number] Patient Account Number: [id number] Date of Birth/Sex: Treating RN: 09-14-1951 (73 y.o. F) Primary Care Jadarius Commons: Marlan Palau Other Clinician: Referring Tocara Mennen: Treating Elvenia Godden/Extender: Cay Schillings in Treatment: 0 Vital Signs Height(in): 63 Pulse(bpm):  72 Weight(lbs): 127 Blood Pressure(mmHg): 146/85 Body Mass Index(BMI): 22.5 Temperature(F): 98.7 Respiratory Rate(breaths/min): 20 Julia Lee, Julia Lee ([id number]) [id number] Page 5 of 10 [1:Photos:] [N/A:N/A] Right, Anterior Lower Leg N/A N/A Wound Location: Trauma N/A N/A Wounding Event: Venous Leg Ulcer N/A N/A Primary Etiology: 07/12/2023 N/A N/A Date Acquired: 0 N/A N/A Weeks of Treatment: Open N/A N/A Wound Status: No N/A N/A Wound Recurrence: 0.7x1x0.1 N/A N/A Measurements Lee x W x D (cm) 0.55 N/A N/A A (cm) : rea 0.055 N/A N/A Volume (cm) : Full Thickness Without Exposed N/A N/A Classification: Support Structures Medium N/A N/A Exudate Amount: Serosanguineous N/A N/A Exudate Type: red, brown N/A N/A Exudate Color: Distinct, outline attached N/A N/A Wound Margin: Large (67-100%) N/A N/A Granulation Amount: Red, Pink N/A N/A Granulation Quality: Small (1-33%) N/A N/A Necrotic Amount: Fat Layer (Subcutaneous Tissue): Yes N/A N/A Exposed  Structures: Fascia: No Tendon: No Muscle: No Joint: No Bone: No Chemical/Enzymatic/Mechanical N/A N/A Debridement: N/A N/A N/A Instrument: None N/A N/A Bleeding: Debridement Treatment Response: Procedure was tolerated well N/A N/A Post Debridement Measurements Lee x 0.7x1x0.1 N/A N/A W x D (cm) 0.055 N/A N/A Post Debridement Volume: (cm) Excoriation: No N/A N/A Periwound Skin Texture: Induration: No Callus: No Crepitus: No Rash: No Scarring: No Maceration: No N/A N/A Periwound Skin Moisture: Dry/Scaly: No Erythema: Yes N/A N/A Periwound Skin Color: Atrophie Blanche: No Cyanosis: No Ecchymosis: No Hemosiderin Staining: No Mottled: No Pallor: No Rubor: No Circumferential N/A N/A Erythema Location: No Abnormality N/A N/A Temperature: Yes N/A N/A Tenderness on Palpation: Debridement N/A N/A Procedures Performed: Treatment Notes Wound #1 (Lower Leg) Wound Laterality: Right, Anterior Cleanser Vashe 5.8 (oz) Discharge Instruction: Cleanse the wound with Vashe prior to applying a clean dressing using gauze sponges, not tissue or cotton balls. Peri-Wound Care Topical Mupirocin Ointment Discharge Instruction: Apply Mupirocin (Bactroban) as instructed Primary Dressing Secondary Dressing Julia Lee, Julia Lee ([id number]) [id number].pdf Page 6 of 10 Zetuvit Plus Silicone Border Dressing 3x3 (in/in) Discharge Instruction: Apply silicone border or any bandage for protection. Secured With Compression Wrap tubigrip size D Double layer Discharge Instruction: apply in the morning and remove at night. Compression Stockings Add-Ons Electronic Signature(s) Signed: 10/28/2023 12:22:19 PM By: [name] DO Entered By: [name] on 10/28/2023 10:55:06 -------------------------------------------------------------------------------- Multi-Disciplinary Care Plan Details Patient Name: Date of Service: Julia Lee, Julia Lee 10/28/2023 8:15 A  M Medical Record Number: [id number] Patient Account Number: [id number] Date of Birth/Sex: Treating RN: August 31, 1951 (73 y.o. Debara Pickett, Millard.Loa Primary Care Latoi Giraldo: Marlan Palau Other Clinician: Referring Hazley Dezeeuw: Treating Dahiana Kulak/Extender: Cay Schillings in Treatment: 0 Active Inactive Orientation to the Wound Care Program Nursing Diagnoses: Knowledge deficit related to the wound healing center program Goals: Patient/caregiver will verbalize understanding of the Wound Healing Center Program Date Initiated: 10/28/2023 Target Resolution Date: 11/18/2023 Goal Status: Active Interventions: Provide education on orientation to the wound center Notes: Pain, Acute or Chronic Nursing Diagnoses: Pain, acute or chronic: actual or potential Goals: Patient will verbalize adequate pain control and receive pain control interventions during procedures as needed Date Initiated: 10/28/2023 Target Resolution Date: 11/19/2023 Goal Status: Active Interventions: Encourage patient to take pain medications as prescribed Provide education on pain management Treatment Activities: Administer pain control measures as ordered : 10/28/2023 Notes: Wound/Skin Impairment Nursing Diagnoses: Knowledge deficit related to ulceration/compromised skin integrity Julia Lee, Julia Lee ([id number]) [id number] Page 7 of 10 Goals: Patient/caregiver will verbalize understanding of skin care regimen Date Initiated: 10/28/2023 Target Resolution Date: 11/17/2023 Goal Status: Active Interventions: Assess patient/caregiver ability to  perform ulcer/skin care regimen upon admission and as needed Assess ulceration(s) every visit Provide education on ulcer and skin care Treatment Activities: Skin care regimen initiated : 10/28/2023 Topical wound management initiated : 10/28/2023 Notes: Electronic Signature(s) Signed: 10/31/2023 2:44:18 PM By: Shawn Stall RN, BSN Entered By: Shawn Stall on 10/28/2023 09:10:00 -------------------------------------------------------------------------------- Pain Assessment Details Patient Name: Date of Service: Julia Lee 10/28/2023 8:15 A M Medical Record Number: [id number] Patient Account Number: [id number] Date of Birth/Sex: Treating RN: 1951-01-16 (73 y.o. Arta Silence Primary Care Lind Ausley: Marlan Palau Other Clinician: Referring Linn Clavin: Treating Adilynn Bessey/Extender: Cay Schillings in Treatment: 0 Active Problems Location of Pain Severity and Description of Pain Patient Has Paino Yes Site Locations Pain Location: Pain in Ulcers Rate the pain. Current Pain Level: 8 Character of Pain Describe the Pain: Heavy, Splitting, Stabbing Pain Management and Medication Current Pain Management: Medication: No Cold Application: No Rest: No Massage: No Activity: No T.E.N.S.: No Heat Application: No Leg drop or elevation: No Is the Current Pain Management Adequate: Adequate How does your wound impact your activities of daily livingo Sleep: No Bathing: No Appetite: No Relationship With Others: No Julia Lee, Julia Lee ([id number]) [id number] Page 8 of 10 Bladder Continence: No Emotions: No Bowel Continence: No Work: No Toileting: No Drive: No Dressing: No Hobbies: No Psychologist, prison and probation services) Signed: 10/31/2023 2:44:18 PM By: Shawn Stall RN, BSN Entered By: Shawn Stall on 10/28/2023 08:29:36 -------------------------------------------------------------------------------- Patient/Caregiver Education Details Patient Name: Date of Service: Julia Lee 1/17/2025andnbsp8:15 A M Medical Record Number: [id number] Patient Account Number: [id number] Date of Birth/Gender: Treating RN: Nov 23, 1950 (73 y.o. Arta Silence Primary Care Physician: Marlan Palau Other Clinician: Referring Physician: Treating Physician/Extender: Cay Schillings in  Treatment: 0 Education Assessment Education Provided To: Patient Education Topics Provided Welcome T [facility] Packet: o Handouts: [facility] Pledge form, Welcome T [facility] o Methods: Explain/Verbal Responses: Reinforcements needed Electronic Signature(s) Signed: 10/31/2023 2:44:18 PM By: Shawn Stall RN, BSN Entered By: Shawn Stall on 10/28/2023 09:10:11 -------------------------------------------------------------------------------- Wound Assessment Details Patient Name: Date of Service: Julia Lee, Julia Lee 10/28/2023 8:15 A M Medical Record Number: [id number] Patient Account Number: [id number] Date of Birth/Sex: Treating RN: November 22, 1950 (73 y.o. Arta Silence Primary Care Hollie Wojahn: Marlan Palau Other Clinician: Referring Adelaide Pfefferkorn: Treating Dodie Parisi/Extender: Cay Schillings in Treatment: 0 Wound Status Wound Number: 1 Primary Etiology: Venous Leg Ulcer Wound Location: Right, Anterior Lower Leg Wound Status: Open Wounding Event: Trauma Date Acquired: 07/12/2023 Weeks Of Treatment: 0 Clustered Wound: No Photos Julia Lee, Julia Lee ([id number]) [phone].pdf Page 9 of 10 Wound Measurements Length: (cm) 0.7 Width: (cm) 1 Depth: (cm) 0.1 Area: (cm) 0.55 Volume: (cm) 0.055 % Reduction in Area: % Reduction in Volume: Tunneling: No Undermining: No Wound Description Classification: Full Thickness Without Exposed Support Structures Wound Margin: Distinct, outline attached Exudate Amount: Medium Exudate Type: Serosanguineous Exudate Color: red, brown Foul Odor After Cleansing: No Slough/Fibrino Yes Wound Bed Granulation Amount: Large (67-100%) Exposed Structure Granulation Quality: Red, Pink Fascia Exposed: No Necrotic Amount: Small (1-33%) Fat Layer (Subcutaneous Tissue) Exposed: Yes Necrotic Quality: Adherent Slough Tendon Exposed: No Muscle Exposed: No Joint Exposed:  No Bone Exposed: No Periwound Skin Texture Texture Color No Abnormalities Noted: No No Abnormalities Noted: No Callus: No Atrophie Blanche: No Crepitus: No Cyanosis: No Excoriation: No Ecchymosis: No Induration: No Erythema: Yes Rash: No Erythema Location: Circumferential Scarring: No Hemosiderin Staining: No Mottled: No Moisture Pallor: No No Abnormalities Noted: No  Rubor: No Dry / Scaly: No Maceration: No Temperature / Pain Temperature: No Abnormality Tenderness on Palpation: Yes Treatment Notes Wound #1 (Lower Leg) Wound Laterality: Right, Anterior Cleanser Vashe 5.8 (oz) Discharge Instruction: Cleanse the wound with Vashe prior to applying a clean dressing using gauze sponges, not tissue or cotton balls. Peri-Wound Care Topical Mupirocin Ointment Discharge Instruction: Apply Mupirocin (Bactroban) as instructed Primary Dressing Secondary Dressing Zetuvit Plus Silicone Border Dressing 3x3 (in/in) Discharge Instruction: Apply silicone border or any bandage for protection. Secured With Compression Julia Lee, Julia Lee ([id number]) [id number] Page 10 of 10 tubigrip size D Double layer Discharge Instruction: apply in the morning and remove at night. Compression Stockings Add-Ons Electronic Signature(s) Signed: 10/31/2023 2:44:18 PM By: Shawn Stall RN, BSN Entered By: Shawn Stall on 10/28/2023 08:45:41 -------------------------------------------------------------------------------- Vitals Details Patient Name: Date of Service: Julia Lee 10/28/2023 8:15 A M Medical Record Number: [id number] Patient Account Number: [id number] Date of Birth/Sex: Treating RN: 1951-05-06 (73 y.o. Debara Pickett, Millard.Loa Primary Care Khalin Royce: Marlan Palau Other Clinician: Referring Bartow Zylstra: Treating Shantanu Strauch/Extender: Cay Schillings in Treatment: 0 Vital Signs Time Taken: 08:27 Temperature (F): 98.7 Height (in): 63 Pulse  (bpm): 72 Source: Stated Respiratory Rate (breaths/min): 20 Weight (lbs): 127 Blood Pressure (mmHg): 146/85 Source: Stated Reference Range: 80 - 120 mg / dl Body Mass Index (BMI): 22.5 Electronic Signature(s) Signed: 10/31/2023 2:44:18 PM By: Shawn Stall RN, BSN Entered By: Shawn Stall on 10/28/2023 08:31:45

## 2023-10-31 NOTE — Progress Notes (Signed)
Julia, Lee ([id number]) [phone].pdf Page 1 of 4 Visit Report for 10/28/2023 Abuse Risk Screen Details Patient Name: Date of Service: Julia Lee, Julia Lee 10/28/2023 8:15 A M Medical Record Number: [id number] Patient Account Number: [id number] Date of Birth/Sex: Treating RN: 25-Mar-1951 (73 y.o. Arta Silence Primary Care Chestine Belknap: Marlan Palau Other Clinician: Referring Jeanette Rauth: Treating Tanara Turvey/Extender: Cay Schillings in Treatment: 0 Abuse Risk Screen Items Answer ABUSE RISK SCREEN: Has anyone close to you tried to hurt or harm you recentlyo No Do you feel uncomfortable with anyone in your familyo No Has anyone forced you do things that you didnt want to doo No Electronic Signature(s) Signed: 10/31/2023 2:44:18 PM By: Shawn Stall RN, BSN Entered By: Shawn Stall on 10/28/2023 08:27:40 -------------------------------------------------------------------------------- Activities of Daily Living Details Patient Name: Date of Service: Julia, Lee 10/28/2023 8:15 A M Medical Record Number: [id number] Patient Account Number: [id number] Date of Birth/Sex: Treating RN: 1950-12-23 (73 y.o. Arta Silence Primary Care Pressley Tadesse: Marlan Palau Other Clinician: Referring Creta Dorame: Treating Maikol Grassia/Extender: Cay Schillings in Treatment: 0 Activities of Daily Living Items Answer Activities of Daily Living (Please select one for each item) Drive Automobile Completely Able T Medications ake Completely Able Use T elephone Completely Able Care for Appearance Completely Able Use T oilet Completely Able Bath / Shower Completely Able Dress Self Completely Able Feed Self Completely Able Walk Completely Able Get In / Out Bed Completely Able Housework Completely Able Prepare Meals Completely Able Handle Money Completely Able Shop for Self Completely Able Electronic Signature(s) Signed: 10/31/2023  2:44:18 PM By: Shawn Stall RN, BSN Entered By: Shawn Stall on 10/28/2023 08:28:01 Julia Lee ([id number]) [id number] Page 2 of 4 -------------------------------------------------------------------------------- Education Screening Details Patient Name: Date of Service: Julia, Lee 10/28/2023 8:15 A M Medical Record Number: [id number] Patient Account Number: [id number] Date of Birth/Sex: Treating RN: 11-08-1950 (73 y.o. Arta Silence Primary Care Abagail Limb: Marlan Palau Other Clinician: Referring Shahzain Kiester: Treating Luvada Salamone/Extender: Cay Schillings in Treatment: 0 Primary Learner Assessed: Patient Learning Preferences/Education Level/Primary Language Learning Preference: Explanation, Demonstration, Printed Material Highest Education Level: College or Above Preferred Language: English Cognitive Barrier Language Barrier: No Translator Needed: No Memory Deficit: No Emotional Barrier: No Cultural/Religious Beliefs Affecting Medical Care: No Physical Barrier Impaired Vision: Yes Glasses, reading Impaired Hearing: No Decreased Hand dexterity: No Knowledge/Comprehension Knowledge Level: High Comprehension Level: High Ability to understand written instructions: High Ability to understand verbal instructions: High Motivation Anxiety Level: Calm Cooperation: Cooperative Education Importance: Acknowledges Need Interest in Health Problems: Asks Questions Perception: Coherent Willingness to Engage in Self-Management High Activities: Readiness to Engage in Self-Management High Activities: Electronic Signature(s) Signed: 10/31/2023 2:44:18 PM By: Shawn Stall RN, BSN Entered By: Shawn Stall on 10/28/2023 08:28:27 -------------------------------------------------------------------------------- Fall Risk Assessment Details Patient Name: Date of Service: Julia Lee 10/28/2023 8:15 A M Medical Record  Number: [id number] Patient Account Number: [id number] Date of Birth/Sex: Treating RN: 26-Apr-1951 (73 y.o. Arta Silence Primary Care Cyrstal Leitz: Marlan Palau Other Clinician: Referring Foy Vanduyne: Treating Tanisia Yokley/Extender: Cay Schillings in Treatment: 0 Fall Risk Assessment Items Have you had 2 or more falls in the last 12 monthso 0 No Jerrell, Mayrani L ([id number]) [id number] Page 3 of 4 Have you had any fall that resulted in injury in the last 12 monthso 0 No FALLS RISK SCREEN History of falling - immediate or within 3 months 0 No Secondary diagnosis (Do you have 2 or more medical diagnoseso) 0 No  Ambulatory aid None/bed rest/wheelchair/nurse 0 Yes Crutches/cane/walker 0 No Furniture 0 No Intravenous therapy Access/Saline/Heparin Lock 0 No Gait/Transferring Normal/ bed rest/ wheelchair 0 Yes Weak (short steps with or without shuffle, stooped but able to lift head while walking, may seek 0 No support from furniture) Impaired (short steps with shuffle, may have difficulty arising from chair, head down, impaired 0 No balance) Mental Status Oriented to own ability 0 Yes Electronic Signature(s) Signed: 10/31/2023 2:44:18 PM By: Shawn Stall RN, BSN Entered By: Shawn Stall on 10/28/2023 08:29:15 -------------------------------------------------------------------------------- Foot Assessment Details Patient Name: Date of Service: Julia Lee 10/28/2023 8:15 A M Medical Record Number: [id number] Patient Account Number: [id number] Date of Birth/Sex: Treating RN: 1950/11/28 (73 y.o. Arta Silence Primary Care Berlin Mokry: Marlan Palau Other Clinician: Referring Welden Hausmann: Treating Argelio Granier/Extender: Cay Schillings in Treatment: 0 Foot Assessment Items Site Locations + = Sensation present, - = Sensation absent, C = Callus, U = Ulcer R = Redness, W = Warmth, M = Maceration, PU = Pre-ulcerative  lesion F = Fissure, S = Swelling, D = Dryness Assessment Right: Left: Other Deformity: No No Prior Foot Ulcer: No No Prior Amputation: No No Charcot Joint: No No Ambulatory Status: Ambulatory Without Help [name], [name] ([id number]) [id number] Page 4 of 4 Electronic Signature(s) Signed: 10/31/2023 2:44:18 PM By: Shawn Stall RN, BSN Entered By: Shawn Stall on 10/28/2023 08:42:51 -------------------------------------------------------------------------------- Nutrition Risk Screening Details Patient Name: Date of Service: ABI, BAAS 10/28/2023 8:15 A M Medical Record Number: [id number] Patient Account Number: [id number] Date of Birth/Sex: Treating RN: November 15, 1950 (73 y.o. Debara Pickett, Millard.Loa Primary Care Donnielle Addison: Marlan Palau Other Clinician: Referring Khamora Karan: Treating Addalyn Speedy/Extender: Cay Schillings in Treatment: 0 Height (in): 63 Weight (lbs): 127 Body Mass Index (BMI): 22.5 Nutrition Risk Screening Items Score Screening NUTRITION RISK SCREEN: I have an illness or condition that made me change the kind and/or amount of food I eat 2 Yes I eat fewer than two meals per day 0 No I eat few fruits and vegetables, or milk products 0 No I have three or more drinks of beer, liquor or wine almost every day 0 No I have tooth or mouth problems that make it hard for me to eat 0 No I don't always have enough money to buy the food I need 0 No I eat alone most of the time 0 No I take three or more different prescribed or over-the-counter drugs a day 1 Yes Without wanting to, I have lost or gained 10 pounds in the last six months 0 No I am not always physically able to shop, cook and/or feed myself 0 No Nutrition Protocols Good Risk Protocol Moderate Risk Protocol 0 Provide education on nutrition High Risk Proctocol Risk Level: Moderate Risk Score: 3 Electronic Signature(s) Signed: 10/31/2023 2:44:18 PM  By: Shawn Stall RN, BSN Entered By: Shawn Stall on 10/28/2023 08:33:52

## 2023-10-31 NOTE — Progress Notes (Signed)
Julia, Lee ([id number]) [id number] Page 1 of 8 Visit Report for 10/28/2023 Chief Complaint Document Details Patient Name: Date of Service: Julia Lee, Julia Lee 10/28/2023 8:15 A M Medical Record Number: [id number] Patient Account Number: [id number] Date of Birth/Sex: Treating RN: 10/27/1950 (73 y.o. F) Primary Care Provider: Marlan Palau Other Clinician: Referring Provider: Treating Provider/Extender: Cay Schillings in Treatment: 0 Information Obtained from: Patient Chief Complaint 10/28/2023; right lower extremity wound Electronic Signature(s) Signed: 10/28/2023 12:22:19 PM By: [name] DO Entered By: [name] on 10/28/2023 10:58:38 -------------------------------------------------------------------------------- Debridement Details Patient Name: Date of Service: Julia Lee 10/28/2023 8:15 A M Medical Record Number: [id number] Patient Account Number: [id number] Date of Birth/Sex: Treating RN: 1950/12/10 (73 y.o. Arta Silence Primary Care Provider: Marlan Palau Other Clinician: Referring Provider: Treating Provider/Extender: Cay Schillings in Treatment: 0 Debridement Performed for Assessment: Wound #1 Right,Anterior Lower Leg Performed By: Clinician Shawn Stall, RN Debridement Type: Chemical/Enzymatic/Mechanical Agent Used: gauze and wound cleanser Severity of Tissue Pre Debridement: Fat layer exposed Level of Consciousness (Pre-procedure): Awake and Alert Pre-procedure Verification/Time Out No Taken: Percent of Wound Bed Debrided: Bleeding: None Response to Treatment: Procedure was tolerated well Level of Consciousness (Post- Awake and Alert procedure): Post Debridement Measurements of Total Wound Length: (cm) 0.7 Width: (cm) 1 Depth: (cm) 0.1 Volume: (cm) 0.055 Character of Wound/Ulcer Post Debridement: Stable Severity of Tissue Post Debridement: Fat layer  exposed Post Procedure Diagnosis Same as Pre-procedure Electronic Signature(s) Signed: 10/28/2023 12:22:19 PM By: [name] DO Signed: 10/31/2023 2:44:18 PM By: Shawn Stall RN, BSN [place], Renae Fickle ([id number]) [phone].pdf Page 2 of 8 Entered By: Shawn Stall on 10/28/2023 09:16:25 -------------------------------------------------------------------------------- HPI Details Patient Name: Date of Service: Julia, Lee 10/28/2023 8:15 A M Medical Record Number: [id number] Patient Account Number: [id number] Date of Birth/Sex: Treating RN: 15-Nov-1950 (73 y.o. F) Primary Care Provider: Marlan Palau Other Clinician: Referring Provider: Treating Provider/Extender: Cay Schillings in Treatment: 0 History of Present Illness HPI Description: 10/28/2023 Ms. Rabekah Norwich is a 73 year old female with a past medical history of bipolar disorder, migraines and osteoporosis that presents to the clinic for a 67-month history of nonhealing wound to the right anterior leg. She states that she was at her [facility] house when she hit the corner of a bed and created a wound. She reports the area is healing slowly however worsened over the past week. She saw her primary care physician on 10/24/2023 for this issue and was prescribed mupirocin ointment and doxycycline for potential wound infection. Currently patient reports chronic pain to the wound site. She denies increased warmth, erythema or purulent drainage. She does not wear compression stockings. Electronic Signature(s) Signed: 10/28/2023 12:22:19 PM By: [name] DO Entered By: [name] on 10/28/2023 11:06:31 -------------------------------------------------------------------------------- Physical Exam Details Patient Name: Date of Service: Julia, Lee 10/28/2023 8:15 A M Medical Record Number: [id number] Patient Account Number: [id number] Date of Birth/Sex: Treating  RN: 1950/11/24 (73 y.o. F) Primary Care Provider: Marlan Palau Other Clinician: Referring Provider: Treating Provider/Extender: Cay Schillings in Treatment: 0 Constitutional respirations regular, non-labored and within target range for patient.. Cardiovascular 2+ dorsalis pedis/posterior tibialis pulses. Psychiatric pleasant and cooperative. Notes T the right anterior lower leg there is an area of purple coloration with Small open wounds that have a similar appearance to when an area is infected and o drains. Small open areas have granulation tissue. No surrounding signs of infection including increased warmth, erythema or purulent drainage. Difficult to  fully examine due to patient having pain. Electronic Signature(s) Signed: 10/28/2023 12:22:19 PM By: [name] DO Entered By: [name] on 10/28/2023 11:13:09 Julia Lee ([id number]) [id number] Page 3 of 8 -------------------------------------------------------------------------------- Physician Orders Details Patient Name: Date of Service: TEREZA, MORSCH 10/28/2023 8:15 A M Medical Record Number: [id number] Patient Account Number: [id number] Date of Birth/Sex: Treating RN: July 01, 1951 (73 y.o. Julia Lee, Millard.Loa Primary Care Provider: Marlan Palau Other Clinician: Referring Provider: Treating Provider/Extender: Cay Schillings in Treatment: 0 The following information was scribed by: Shawn Stall The information was scribed for: [name] Verbal / Phone Orders: No Diagnosis Coding ICD-10 Coding Code Description [phone] Non-pressure chronic ulcer of other part of right lower leg with fat layer exposed I87.311 Chronic venous hypertension (idiopathic) with ulcer of right lower extremity Follow-up Appointments ppointment in 1 week. - Dr. Mikey Bussing (front office to schedule) Return A ppointment in 2 weeks. - Dr. Mikey Bussing (front office  to schedule) Return A Return appointment in 3 weeks. - Dr. Mikey Bussing (front office to schedule) Anesthetic (In clinic) Topical Lidocaine 5% applied to wound bed Edema Control - Orders / Instructions Elevate legs to the level of the heart or above for 30 minutes daily and/or when sitting for 3-4 times a day throughout the day. Avoid standing for long periods of time. Exercise regularly Moisturize legs daily. Wound Treatment Wound #1 - Lower Leg Wound Laterality: Right, Anterior Cleanser: Vashe 5.8 (oz) 1 x Per Day/15 Days Discharge Instructions: Cleanse the wound with Vashe prior to applying a clean dressing using gauze sponges, not tissue or cotton balls. Topical: Mupirocin Ointment 1 x Per Day/15 Days Discharge Instructions: Apply Mupirocin (Bactroban) as instructed Secondary Dressing: Zetuvit Plus Silicone Border Dressing 3x3 (in/in) 1 x Per Day/15 Days Discharge Instructions: Apply silicone border or any bandage for protection. Compression Wrap: tubigrip size D Double layer 1 x Per Day/15 Days Discharge Instructions: apply in the morning and remove at night. Patient Medications llergies: No Known Drug Allergies A Notifications Medication Indication Start End applied prior to 10/28/2023 lidocaine debridement DOSE topical 5 % cream - cream topical Electronic Signature(s) Signed: 10/28/2023 12:22:19 PM By: [name] DO Entered By: [name] on 10/28/2023 11:14:33 Julia Lee ([id number]) [id number] Page 4 of 8 -------------------------------------------------------------------------------- Problem List Details Patient Name: Date of Service: NIKKY, LEISINGER 10/28/2023 8:15 A M Medical Record Number: [id number] Patient Account Number: [id number] Date of Birth/Sex: Treating RN: [birth year] (73 y.o. F) Primary Care Provider: Marlan Palau Other Clinician: Referring Provider: Treating Provider/Extender: Cay Schillings in Treatment: 0 Active Problems ICD-10 Encounter Code Description Active Date MDM Diagnosis [phone] Non-pressure chronic ulcer of other part of right lower leg with fat layer 10/28/2023 No Yes exposed I87.311 Chronic venous hypertension (idiopathic) with ulcer of right lower extremity 10/28/2023 No Yes Inactive Problems Resolved Problems Electronic Signature(s) Signed: 10/28/2023 12:22:19 PM By: [name] DO Entered By: [name] on 10/28/2023 10:55:01 -------------------------------------------------------------------------------- Progress Note Details Patient Name: Date of Service: Julia Lee 10/28/2023 8:15 A M Medical Record Number: [id number] Patient Account Number: [id number] Date of Birth/Sex: Treating RN: Feb 16, 1951 (73 y.o. F) Primary Care Provider: Marlan Palau Other Clinician: Referring Provider: Treating Provider/Extender: Cay Schillings in Treatment: 0 Subjective Chief Complaint Information obtained from Patient 10/28/2023; right lower extremity wound History of Present Illness (HPI) 10/28/2023 Ms. Gizell Sankovich is a 73 year old female with a past medical history of bipolar disorder, migraines and osteoporosis that presents to the clinic for a 86-month  history of nonhealing wound to the right anterior leg. She states that she was at her [facility] house when she hit the corner of a bed and created a wound. She reports the area is healing slowly however worsened over the past week. She saw her primary care physician on 10/24/2023 for this issue and was prescribed mupirocin ointment and doxycycline for potential wound infection. Currently patient reports chronic pain to the wound site. She denies increased warmth, erythema or purulent drainage. She does not wear compression stockings. Patient History Allergies No Known Drug Allergies General Notes: per patient the 4 current medication divalproex, oxycodone, topamax,  gabapentin more side effects then allergies per patient. remove from her list. ZOEYANN, GRAPER ([id number]) [id number] Page 5 of 8 Family History Cancer - Mother,Maternal Grandparents, Diabetes - Maternal Grandparents, Heart Disease - Father. Social History Never smoker, Marital Status - Married, Alcohol Use - Never, Drug Use - No History, Caffeine Use - Rarely. Medical History Hospitalization/Surgery History - rotator cuff repair left. - toe surgery. Medical A Surgical History Notes nd Musculoskeletal osteoporosis Neurologic migraines Oncologic Skin Ca right arm Psychiatric bipolar disorder Review of Systems (ROS) Integumentary (Skin) Complains or has symptoms of Wounds - right leg. Objective Constitutional respirations regular, non-labored and within target range for patient.. Vitals Time Taken: 8:27 AM, Height: 63 in, Source: Stated, Weight: 127 lbs, Source: Stated, BMI: 22.5, Temperature: 98.7 F, Pulse: 72 bpm, Respiratory Rate: 20 breaths/min, Blood Pressure: 146/85 mmHg. Cardiovascular 2+ dorsalis pedis/posterior tibialis pulses. Psychiatric pleasant and cooperative. General Notes: T the right anterior lower leg there is an area of purple coloration with Small open wounds that have a similar appearance to when an area is o infected and drains. Small open areas have granulation tissue. No surrounding signs of infection including increased warmth, erythema or purulent drainage. Difficult to fully examine due to patient having pain. Integumentary (Hair, Skin) Wound #1 status is Open. Original cause of wound was Trauma. The date acquired was: 07/12/2023. The wound is located on the Right,Anterior Lower Leg. The wound measures 0.7cm length x 1cm width x 0.1cm depth; 0.55cm^2 area and 0.055cm^3 volume. There is Fat Layer (Subcutaneous Tissue) exposed. There is no tunneling or undermining noted. There is a medium amount of serosanguineous drainage  noted. The wound margin is distinct with the outline attached to the wound base. There is large (67-100%) red, pink granulation within the wound bed. There is a small (1-33%) amount of necrotic tissue within the wound bed including Adherent Slough. The periwound skin appearance exhibited: Erythema. The periwound skin appearance did not exhibit: Callus, Crepitus, Excoriation, Induration, Rash, Scarring, Dry/Scaly, Maceration, Atrophie Blanche, Cyanosis, Ecchymosis, Hemosiderin Staining, Mottled, Pallor, Rubor. The surrounding wound skin color is noted with erythema which is circumferential. Periwound temperature was noted as No Abnormality. The periwound has tenderness on palpation. Assessment Active Problems ICD-10 Non-pressure chronic ulcer of other part of right lower leg with fat layer exposed Chronic venous hypertension (idiopathic) with ulcer of right lower extremity Patient presents with a 18-month history of nonhealing area to her right lower extremity initially caused by trauma and nonhealing due to chronic venous insufficiency and infection. She was started recently on doxycycline and has been tolerating this well. She has been using mupirocin ointment to the wound beds. I could not fully examine the area due to patient's pain. At this time I recommended continuing with mupirocin ointment and using Tubigrip to help with edema control. If she does not improve with oral antibiotics and antibiotic ointment I  will likely do a round of Augmentin and obtain an x-ray. She knows to call with any questions or concerns. We will see her early next week. Procedures Wound #1 ARHONDA, VANHUSS L ([id number]) [phone].pdf Page 6 of 8 Pre-procedure diagnosis of Wound #1 is a Venous Leg Ulcer located on the Right,Anterior Lower Leg .Severity of Tissue Pre Debridement is: Fat layer exposed. There was a Chemical/Enzymatic/Mechanical debridement performed by Shawn Stall, RN.. Other  agent used was gauze and wound cleanser. There was no bleeding. The procedure was tolerated well. Post Debridement Measurements: 0.7cm length x 1cm width x 0.1cm depth; 0.055cm^3 volume. Character of Wound/Ulcer Post Debridement is stable. Severity of Tissue Post Debridement is: Fat layer exposed. Post procedure Diagnosis Wound #1: Same as Pre-Procedure Plan Follow-up Appointments: Return Appointment in 1 week. - Dr. Mikey Bussing (front office to schedule) Return Appointment in 2 weeks. - Dr. Mikey Bussing (front office to schedule) Return appointment in 3 weeks. - Dr. Mikey Bussing (front office to schedule) Anesthetic: (In clinic) Topical Lidocaine 5% applied to wound bed Edema Control - Orders / Instructions: Elevate legs to the level of the heart or above for 30 minutes daily and/or when sitting for 3-4 times a day throughout the day. Avoid standing for long periods of time. Exercise regularly Moisturize legs daily. The following medication(s) was prescribed: lidocaine topical 5 % cream cream topical for applied prior to debridement was prescribed at facility WOUND #1: - Lower Leg Wound Laterality: Right, Anterior Cleanser: Vashe 5.8 (oz) 1 x Per Day/15 Days Discharge Instructions: Cleanse the wound with Vashe prior to applying a clean dressing using gauze sponges, not tissue or cotton balls. Topical: Mupirocin Ointment 1 x Per Day/15 Days Discharge Instructions: Apply Mupirocin (Bactroban) as instructed Secondary Dressing: Zetuvit Plus Silicone Border Dressing 3x3 (in/in) 1 x Per Day/15 Days Discharge Instructions: Apply silicone border or any bandage for protection. Com pression Wrap: tubigrip size D Double layer 1 x Per Day/15 Days Discharge Instructions: apply in the morning and remove at night. 1. Continue oral doxycycline 2. Mupirocin ointment 3. Tubigrip 4. Follow-up in 1 week Electronic Signature(s) Signed: 10/28/2023 12:22:19 PM By: [name] DO Entered By: [name] on  10/28/2023 11:17:37 -------------------------------------------------------------------------------- HxROS Details Patient Name: Date of Service: Julia Lee 10/28/2023 8:15 A M Medical Record Number: [id number] Patient Account Number: [id number] Date of Birth/Sex: Treating RN: March 16, 1951 (74 y.o. Arta Silence Primary Care Provider: Marlan Palau Other Clinician: Referring Provider: Treating Provider/Extender: Cay Schillings in Treatment: 0 Integumentary (Skin) Complaints and Symptoms: Positive for: Wounds - right leg Musculoskeletal Medical History: Past Medical History Notes: osteoporosis Neurologic Medical History: Past Medical History Notes: migraines Holben, Vinnie L ([id number]) [phone].pdf Page 7 of 8 Oncologic Medical History: Past Medical History Notes: Skin Ca right arm Psychiatric Medical History: Past Medical History Notes: bipolar disorder Immunizations Pneumococcal Vaccine: Received Pneumococcal Vaccination: Yes Received Pneumococcal Vaccination On or After 60th Birthday: Yes Implantable Devices None Hospitalization / Surgery History Type of Hospitalization/Surgery rotator cuff repair left toe surgery Family and Social History Cancer: Yes - Mother,Maternal Grandparents; Diabetes: Yes - Maternal Grandparents; Heart Disease: Yes - Father; Never smoker; Marital Status - Married; Alcohol Use: Never; Drug Use: No History; Caffeine Use: Rarely Social Determinants of Health (SDOH) 1. In the past 2 months, did you or others you live with eat smaller meals or skip meals because you didn't have money for foodo : No 2. Are you homeless or worried that you might be in the futureo : No 3.  Do you have trouble paying for your utilities (gas, electricity, phone)o : No 4. Do you have trouble finding or paying for a rideo : No 5. Do you need daycare, or better daycare, for your kidso : No 6. Are you unemployed  or without regular incomeo : No 7. Do you need help finding a better jobo : No 8. Do you need help getting more educationo : No 9. Are you concerned about someone in your home using drugs or alcoholo : No 10. Do you feel unsafe in your daily lifeo : No 11. Is anyone in your home threatening or abusing youo : No 12. Do you lack quality relationships that make you feel valued and supportedo : No 13. Do you need help getting cultural information in a language you understando : No 14. Do you need help getting internet accesso : No Advanced Directives and Instructions Spiritual or Cultural beliefs preclude asking about Advance Care Planning: No Advanced Directives: Yes Do not resuscitate: No Living Will: Yes Medical Power of Attorney: Yes Copy Provided: No Surrogate Decision Maker: No Electronic Signature(s) Signed: 10/28/2023 12:22:19 PM By: [name] DO Signed: 10/31/2023 2:44:18 PM By: Shawn Stall RN, BSN Entered By: Shawn Stall on 10/28/2023 08:37:42 -------------------------------------------------------------------------------- SuperBill Details Patient Name: Date of Service: Julia Lee 10/28/2023 Medical Record Number: [id number] Patient Account Number: [id number] Date of Birth/Sex: Treating RN: 12/20/1950 (72 y.o. Arta Silence Primary Care Provider: Marlan Palau Other Clinician: Referring Provider: Treating Provider/Extender: Cay Schillings in TreatmentLATORSHA, KOGAN ([id number]) [id number] Page 8 of 8 Diagnosis Coding ICD-10 Codes Code Description [phone] Non-pressure chronic ulcer of other part of right lower leg with fat layer exposed I87.311 Chronic venous hypertension (idiopathic) with ulcer of right lower extremity Facility Procedures : CPT4 Code: 84132440 Description: 99214 - WOUND CARE VISIT-LEV 4 EST PT Modifier: Quantity: 1 : CPT4 Code: 10272536 Description: 64403 - DEBRIDE W/O ANES  NON SELECT Modifier: Quantity: 1 Physician Procedures : CPT4 Code Description Modifier [id number] 99204 - WC PHYS LEVEL 4 - NEW PT ICD-10 Diagnosis Description L97.812 Non-pressure chronic ulcer of other part of right lower leg with fat layer exposed I87.311 Chronic venous hypertension (idiopathic) with ulcer  of right lower extremity Quantity: 1 Electronic Signature(s) Signed: 10/28/2023 12:22:19 PM By: [name] DO Entered By: [name] on 10/28/2023 11:18:04

## 2023-11-01 ENCOUNTER — Encounter (HOSPITAL_BASED_OUTPATIENT_CLINIC_OR_DEPARTMENT_OTHER): Payer: Medicare PPO | Admitting: Internal Medicine

## 2023-11-01 DIAGNOSIS — L97812 Non-pressure chronic ulcer of other part of right lower leg with fat layer exposed: Secondary | ICD-10-CM | POA: Diagnosis not present

## 2023-11-01 DIAGNOSIS — I87311 Chronic venous hypertension (idiopathic) with ulcer of right lower extremity: Secondary | ICD-10-CM | POA: Diagnosis not present

## 2023-11-01 NOTE — Progress Notes (Signed)
LOELLA, FORINO ([id number]) [id number] Page 1 of 6 Visit Report for 11/01/2023 Chief Complaint Document Details Patient Name: Date of Service: PARISSA, ALONSO 11/01/2023 2:00 PM Medical Record Number: [id number] Patient Account Number: [id number] Date of Birth/Sex: Treating RN: [birth year] (73 y.o. F) Primary Care Provider: Marlan Palau Other Clinician: Referring Provider: Treating Provider/Extender: Cay Schillings in Treatment: 0 Information Obtained from: Patient Chief Complaint 10/28/2023; right lower extremity wound Electronic Signature(s) Signed: 11/01/2023 3:27:48 PM By: [name] DO Entered By: [name] on 11/01/2023 14:47:49 -------------------------------------------------------------------------------- HPI Details Patient Name: Date of Service: [name] 11/01/2023 2:00 PM Medical Record Number: [id number] Patient Account Number: [id number] Date of Birth/Sex: Treating RN: June 15, 1951 (73 y.o. F) Primary Care Provider: Marlan Palau Other Clinician: Referring Provider: Treating Provider/Extender: Cay Schillings in Treatment: 0 History of Present Illness HPI Description: 10/28/2023 Ms. Dafnee Brodhead is a 73 year old female with a past medical history of bipolar disorder, migraines and osteoporosis that presents to the clinic for a 2-month history of nonhealing wound to the right anterior leg. She states that she was at her [facility] house when she hit the corner of a bed and created a wound. She reports the area is healing slowly however worsened over the past week. She saw her primary care physician on 10/24/2023 for this issue and was prescribed mupirocin ointment and doxycycline for potential wound infection. Currently patient reports chronic pain to the wound site. She denies increased warmth, erythema or purulent drainage. She does not wear compression stockings. 11/01/2023;  patient presents for follow-up. She has completed her doxycycline course. She has been using mupirocin ointment to the wound beds. She reports improvement in her symptoms including pain and swelling. She currently denies signs of infection. Electronic Signature(s) Signed: 11/01/2023 3:27:48 PM By: [name] DO Entered By: [name] on 11/01/2023 14:48:29 -------------------------------------------------------------------------------- Physical Exam Details Patient Name: Date of Service: [name] 11/01/2023 2:00 PM Medical Record Number: [id number] Patient Account Number: [id number] MEDHA, SIROKY ([id number]) [phone].pdf Page 2 of 6 Date of Birth/Sex: Treating RN: 09/14/1951 (73 y.o. F) Primary Care Provider: Other Clinician: Marlan Palau Referring Provider: Treating Provider/Extender: Cay Schillings in Treatment: 0 Constitutional respirations regular, non-labored and within target range for patient.. Cardiovascular 2+ dorsalis pedis/posterior tibialis pulses. Psychiatric pleasant and cooperative. Notes T the right anterior lower leg there are 2 open areas 1 with granulation tissue and the other is a pinpoint area with undermining circumfirentially. o Electronic Signature(s) Signed: 11/01/2023 3:27:48 PM By: [name] DO Entered By: [name] on 11/01/2023 15:04:18 -------------------------------------------------------------------------------- Physician Orders Details Patient Name: Date of Service: [name] 11/01/2023 2:00 PM Medical Record Number: [id number] Patient Account Number: [id number] Date of Birth/Sex: Treating RN: September 15, 1951 (73 y.o. Ardis Rowan, Lauren Primary Care Provider: Marlan Palau Other Clinician: Referring Provider: Treating Provider/Extender: Cay Schillings in Treatment: 0 Verbal / Phone Orders: No Diagnosis Coding Follow-up  Appointments ppointment in 1 week. - Dr. Mikey Bussing (front office to schedule) Return A ppointment in 2 weeks. - Dr. Mikey Bussing (front office to schedule) Return A Return appointment in 3 weeks. - Dr. Mikey Bussing (front office to schedule) Anesthetic (In clinic) Topical Lidocaine 5% applied to wound bed Edema Control - Orders / Instructions Elevate legs to the level of the heart or above for 30 minutes daily and/or when sitting for 3-4 times a day throughout the day. Avoid standing for long periods of time. Exercise regularly Moisturize legs daily. Wound Treatment Wound #1 -  Lower Leg Wound Laterality: Right, Anterior Cleanser: Vashe 5.8 (oz) 1 x Per Day/15 Days Discharge Instructions: Cleanse the wound with Vashe prior to applying a clean dressing using gauze sponges, not tissue or cotton balls. Topical: Mupirocin Ointment 1 x Per Day/15 Days Discharge Instructions: Apply Mupirocin (Bactroban) as instructed Secondary Dressing: Zetuvit Plus Silicone Border Dressing 3x3 (in/in) 1 x Per Day/15 Days Discharge Instructions: Apply silicone border or any bandage for protection. Compression Wrap: tubigrip size D Double layer 1 x Per Day/15 Days Discharge Instructions: apply in the morning and remove at night. Electronic Signature(s) Signed: 11/01/2023 3:27:48 PM By: [name] DO Entered By: [name] on 11/01/2023 15:04:36 [name] ([id number]) [id number] Page 3 of 6 -------------------------------------------------------------------------------- Problem List Details Patient Name: Date of Service: SHAMAR, HOLAND 11/01/2023 2:00 PM Medical Record Number: [id number] Patient Account Number: [id number] Date of Birth/Sex: Treating RN: [birth year] (73 y.o. F) Primary Care Provider: Marlan Palau Other Clinician: Referring Provider: Treating Provider/Extender: Cay Schillings in Treatment: 0 Active Problems ICD-10 Encounter Code  Description Active Date MDM Diagnosis [phone] Non-pressure chronic ulcer of other part of right lower leg with fat layer 10/28/2023 No Yes exposed I87.311 Chronic venous hypertension (idiopathic) with ulcer of right lower extremity 10/28/2023 No Yes Inactive Problems Resolved Problems Electronic Signature(s) Signed: 11/01/2023 3:27:48 PM By: [name] DO Entered By: [name] on 11/01/2023 14:47:36 -------------------------------------------------------------------------------- Progress Note Details Patient Name: Date of Service: [name] 11/01/2023 2:00 PM Medical Record Number: [id number] Patient Account Number: [id number] Date of Birth/Sex: Treating RN: 07-24-1951 (73 y.o. F) Primary Care Provider: Marlan Palau Other Clinician: Referring Provider: Treating Provider/Extender: Cay Schillings in Treatment: 0 Subjective Chief Complaint Information obtained from Patient 10/28/2023; right lower extremity wound History of Present Illness (HPI) 10/28/2023 Ms. Aishleen Rude is a 73 year old female with a past medical history of bipolar disorder, migraines and osteoporosis that presents to the clinic for a 58-month history of nonhealing wound to the right anterior leg. She states that she was at her [facility] house when she hit the corner of a bed and created a wound. She reports the area is healing slowly however worsened over the past week. She saw her primary care physician on 10/24/2023 for this issue and was prescribed mupirocin ointment and doxycycline for potential wound infection. Currently patient reports chronic pain to the wound site. She denies increased warmth, erythema or purulent drainage. She does not wear compression stockings. 11/01/2023; patient presents for follow-up. She has completed her doxycycline course. She has been using mupirocin ointment to the wound beds. She reports improvement in her symptoms including pain and swelling.  She currently denies signs of infection. Patient History JOYLYNN, NEWBERG ([id number]) [id number] Page 4 of 6 Family History Cancer - Mother,Maternal Grandparents, Diabetes - Maternal Grandparents, Heart Disease - Father. Social History Never smoker, Marital Status - Married, Alcohol Use - Never, Drug Use - No History, Caffeine Use - Rarely. Medical History Hospitalization/Surgery History - rotator cuff repair left. - toe surgery. Medical A Surgical History Notes nd Musculoskeletal osteoporosis Neurologic migraines Oncologic Skin Ca right arm Psychiatric bipolar disorder Objective Constitutional respirations regular, non-labored and within target range for patient.. Vitals Time Taken: 2:11 PM, Height: 63 in, Weight: 127 lbs, BMI: 22.5, Temperature: 97.7 F, Pulse: 74 bpm, Respiratory Rate: 18 breaths/min, Blood Pressure: 150/93 mmHg. Cardiovascular 2+ dorsalis pedis/posterior tibialis pulses. Psychiatric pleasant and cooperative. General Notes: T the right anterior lower leg there are 2 open areas 1 with granulation tissue  and the other is a pinpoint area with undermining circumfirentially. o Integumentary (Hair, Skin) Wound #1 status is Open. Original cause of wound was Trauma. The date acquired was: 07/12/2023. The wound is located on the Right,Anterior Lower Leg. The wound measures 0.4cm length x 0.5cm width x 0.1cm depth; 0.157cm^2 area and 0.016cm^3 volume. There is Fat Layer (Subcutaneous Tissue) exposed. There is no tunneling or undermining noted. There is a medium amount of serosanguineous drainage noted. The wound margin is distinct with the outline attached to the wound base. There is large (67-100%) red, pink granulation within the wound bed. There is no necrotic tissue within the wound bed. The periwound skin appearance exhibited: Erythema. The periwound skin appearance did not exhibit: Callus, Crepitus, Excoriation, Induration, Rash,  Scarring, Dry/Scaly, Maceration, Atrophie Blanche, Cyanosis, Ecchymosis, Hemosiderin Staining, Mottled, Pallor, Rubor. The surrounding wound skin color is noted with erythema which is circumferential. Periwound temperature was noted as No Abnormality. The periwound has tenderness on palpation. Assessment Active Problems ICD-10 Non-pressure chronic ulcer of other part of right lower leg with fat layer exposed Chronic venous hypertension (idiopathic) with ulcer of right lower extremity Patient's symptoms of pain and swelling has improved over the past week. She completed her oral antibiotic course. She has a small pinpoint open area that does undermine. Distal to this is an open wound with granulation tissue. She has been using mupirocin ointment and I recommended continuing this. She has been doing well with Tubigrip and I also recommended continuing this daily. Plan Follow-up Appointments: Return Appointment in 1 week. - Dr. Mikey Bussing (front office to schedule) Return Appointment in 2 weeks. - Dr. Mikey Bussing (front office to schedule) Return appointment in 3 weeks. - Dr. Mikey Bussing (front office to schedule) Anesthetic: (In clinic) Topical Lidocaine 5% applied to wound bed Edema Control - Orders / Instructions: Elevate legs to the level of the heart or above for 30 minutes daily and/or when sitting for 3-4 times a day throughout the day. Avoid standing for long periods of time. ZALEYAH, ROOKSTOOL ([id number]) [id number] Page 5 of 6 Exercise regularly Moisturize legs daily. WOUND #1: - Lower Leg Wound Laterality: Right, Anterior Cleanser: Vashe 5.8 (oz) 1 x Per Day/15 Days Discharge Instructions: Cleanse the wound with Vashe prior to applying a clean dressing using gauze sponges, not tissue or cotton balls. Topical: Mupirocin Ointment 1 x Per Day/15 Days Discharge Instructions: Apply Mupirocin (Bactroban) as instructed Secondary Dressing: Zetuvit Plus Silicone Border  Dressing 3x3 (in/in) 1 x Per Day/15 Days Discharge Instructions: Apply silicone border or any bandage for protection. Com pression Wrap: tubigrip size D Double layer 1 x Per Day/15 Days Discharge Instructions: apply in the morning and remove at night. 1. Antibiotic ointment daily 2. Tubigrip daily 3. Follow-up in 1 week Electronic Signature(s) Signed: 11/01/2023 3:27:48 PM By: [name] DO Entered By: [name] on 11/01/2023 15:19:43 -------------------------------------------------------------------------------- HxROS Details Patient Name: Date of Service: [name] 11/01/2023 2:00 PM Medical Record Number: [id number] Patient Account Number: [id number] Date of Birth/Sex: Treating RN: 1951-07-13 (73 y.o. F) Primary Care Provider: Marlan Palau Other Clinician: Referring Provider: Treating Provider/Extender: Cay Schillings in Treatment: 0 Musculoskeletal Medical History: Past Medical History Notes: osteoporosis Neurologic Medical History: Past Medical History Notes: migraines Oncologic Medical History: Past Medical History Notes: Skin Ca right arm Psychiatric Medical History: Past Medical History Notes: bipolar disorder Immunizations Pneumococcal Vaccine: Received Pneumococcal Vaccination: Yes Received Pneumococcal Vaccination On or After 60th Birthday: Yes Implantable Devices None Hospitalization / Surgery History Type  of Hospitalization/Surgery rotator cuff repair left toe surgery ADELEINE, GERADS L ([id number]) [phone].pdf Page 6 of 6 Family and Social History Cancer: Yes - Mother,Maternal Grandparents; Diabetes: Yes - Maternal Grandparents; Heart Disease: Yes - Father; Never smoker; Marital Status - Married; Alcohol Use: Never; Drug Use: No History; Caffeine Use: Rarely Social Determinants of Health (SDOH) 1. In the past 2 months, did you or others you live with eat smaller meals or skip meals  because you didn't have money for foodo : No 2. Are you homeless or worried that you might be in the futureo : No 3. Do you have trouble paying for your utilities (gas, electricity, phone)o : No 4. Do you have trouble finding or paying for a rideo : No 5. Do you need daycare, or better daycare, for your kidso : No 6. Are you unemployed or without regular incomeo : No 7. Do you need help finding a better jobo : No 8. Do you need help getting more educationo : No 9. Are you concerned about someone in your home using drugs or alcoholo : No 10. Do you feel unsafe in your daily lifeo : No 11. Is anyone in your home threatening or abusing youo : No 12. Do you lack quality relationships that make you feel valued and supportedo : No 13. Do you need help getting cultural information in a language you understando : No 14. Do you need help getting internet accesso : No Advanced Directives and Instructions Spiritual or Cultural beliefs preclude asking about Advance Care Planning: No Advanced Directives: Yes Do not resuscitate: No Living Will: Yes Medical Power of Attorney: Yes Copy Provided: No Surrogate Decision Maker: No Electronic Signature(s) Signed: 11/01/2023 3:27:48 PM By: [name] DO Entered By: [name] on 11/01/2023 14:54:33 -------------------------------------------------------------------------------- SuperBill Details Patient Name: Date of Service: [name] 11/01/2023 Medical Record Number: [id number] Patient Account Number: [id number] Date of Birth/Sex: Treating RN: September 10, 1951 (73 y.o. F) Primary Care Provider: Marlan Palau Other Clinician: Referring Provider: Treating Provider/Extender: Cay Schillings in Treatment: 0 Diagnosis Coding ICD-10 Codes Code Description [phone] Non-pressure chronic ulcer of other part of right lower leg with fat layer exposed I87.311 Chronic venous hypertension (idiopathic) with ulcer of right lower  extremity Physician Procedures : CPT4 Code Description Modifier [id number] - WC PHYS LEVEL 3 - EST PT ICD-10 Diagnosis Description L97.812 Non-pressure chronic ulcer of other part of right lower leg with fat layer exposed I87.311 Chronic venous hypertension (idiopathic) with ulcer  of right lower extremity Quantity: 1 Electronic Signature(s) Signed: 11/01/2023 3:27:48 PM By: [name] DO Entered By: [name] on 11/01/2023 15:19:54

## 2023-11-02 ENCOUNTER — Ambulatory Visit: Payer: Medicare PPO | Admitting: Family Medicine

## 2023-11-02 ENCOUNTER — Encounter: Payer: Self-pay | Admitting: Family Medicine

## 2023-11-02 VITALS — BP 167/111 | HR 110 | Ht 63.0 in | Wt 135.5 lb

## 2023-11-02 DIAGNOSIS — G43709 Chronic migraine without aura, not intractable, without status migrainosus: Secondary | ICD-10-CM | POA: Diagnosis not present

## 2023-11-02 DIAGNOSIS — G629 Polyneuropathy, unspecified: Secondary | ICD-10-CM | POA: Diagnosis not present

## 2023-11-02 MED ORDER — SUMATRIPTAN 20 MG/ACT NA SOLN: 20.0000 mg | NASAL | 6 refills | Status: DC | PRN

## 2023-11-02 NOTE — Patient Instructions (Signed)
Below is our plan:  We will continue sumatriptan as needed. Continue to monitor paresthesias in your feet. We can perform EMG if you wish. Consider alpha lipoic acid if approved by your care team.   Please make sure you are staying well hydrated. I recommend 50-60 ounces daily. Well balanced diet and regular exercise encouraged. Consistent sleep schedule with 6-8 hours recommended.   Please continue follow up with care team as directed.   Follow up with me in 1 year  You may receive a survey regarding today's visit. I encourage you to leave honest feed back as I do use this information to improve patient care. Thank you for seeing me today!   GENERAL HEADACHE INFORMATION:   Natural supplements: Magnesium Oxide or Magnesium Glycinate 500 mg at bed (up to 800 mg daily) Coenzyme Q10 300 mg in AM Vitamin B2- 200 mg twice a day   Add 1 supplement at a time since even natural supplements can have undesirable side effects. You can sometimes buy supplements cheaper (especially Coenzyme Q10) at www.WebmailGuide.co.za or at [facility].  Migraine with aura: There is increased risk for stroke in women with migraine with aura and a contraindication for the combined contraceptive pill for use by women who have migraine with aura. The risk for women with migraine without aura is lower. However other risk factors like smoking are far more likely to increase stroke risk than migraine. There is a recommendation for no smoking and for the use of OCPs without estrogen such as progestogen only pills particularly for women with migraine with aura.Marland Kitchen People who have migraine headaches with auras may be 3 times more likely to have a stroke caused by a blood clot, compared to migraine patients who don't see auras. Women who take hormone-replacement therapy may be 30 percent more likely to suffer a clot-based stroke than women not taking medication containing estrogen. Other risk factors like smoking and high blood pressure may be   much more important.    Vitamins and herbs that show potential:   Magnesium: Magnesium (250 mg twice a day or 500 mg at bed) has a relaxant effect on smooth muscles such as blood vessels. Individuals suffering from frequent or daily headache usually have low magnesium levels which can be increase with daily supplementation of 400-750 mg. Three trials found 40-90% average headache reduction  when used as a preventative. Magnesium may help with headaches are aura, the best evidence for magnesium is for migraine with aura is its thought to stop the cortical spreading depression we believe is the pathophysiology of migraine aura.Magnesium also demonstrated the benefit in menstrually related migraine.  Magnesium is part of the messenger system in the serotonin cascade and it is a good muscle relaxant.  It is also useful for constipation which can be a side effect of other medications used to treat migraine. Good sources include nuts, whole grains, and tomatoes. Side Effects: loose stool/diarrhea  Riboflavin (vitamin B 2) 200 mg twice a day. This vitamin assists nerve cells in the production of ATP a principal energy storing molecule.  It is necessary for many chemical reactions in the body.  There have been at least 3 clinical trials of riboflavin using 400 mg per day all of which suggested that migraine frequency can be decreased.  All 3 trials showed significant improvement in over half of migraine sufferers.  The supplement is found in bread, cereal, milk, meat, and poultry.  Most Americans get more riboflavin than the recommended daily allowance, however  riboflavin deficiency is not necessary for the supplements to help prevent headache. Side effects: energizing, green urine   Coenzyme Q10: This is present in almost all cells in the body and is critical component for the conversion of energy.  Recent studies have shown that a nutritional supplement of CoQ10 can reduce the frequency of migraine attacks by  improving the energy production of cells as with riboflavin.  Doses of 150 mg twice a day have been shown to be effective.   Melatonin: Increasing evidence shows correlation between melatonin secretion and headache conditions.  Melatonin supplementation has decreased headache intensity and duration.  It is widely used as a sleep aid.  Sleep is natures way of dealing with migraine.  A dose of 3 mg is recommended to start for headaches including cluster headache. Higher doses up to 15 mg has been reviewed for use in Cluster headache and have been used. The rationale behind using melatonin for cluster is that many theories regarding the cause of Cluster headache center around the disruption of the normal circadian rhythm in the brain.  This helps restore the normal circadian rhythm.   HEADACHE DIET: Foods and beverages which may trigger migraine Note that only 20% of headache patients are food sensitive. You will know if you are food sensitive if you get a headache consistently 20 minutes to 2 hours after eating a certain food. Only cut out a food if it causes headaches, otherwise you might remove foods you enjoy! What matters most for diet is to eat a well balanced healthy diet full of vegetables and low fat protein, and to not miss meals.   Chocolate, other sweets ALL cheeses except cottage and cream cheese Dairy products, yogurt, sour cream, ice cream Liver Meat extracts (Bovril, Marmite, meat tenderizers) Meats or fish which have undergone aging, fermenting, pickling or smoking. These include: Hotdogs,salami,Lox,sausage, mortadellas,smoked salmon, pepperoni, Pickled herring Pods of broad bean (English beans, Chinese pea pods, [place] (fava) beans, lima and navy beans Ripe avocado, ripe banana Yeast extracts or active yeast preparations such as Brewer's or Fleishman's (commercial bakes goods are permitted) Tomato based foods, pizza (lasagna, etc.)   MSG (monosodium glutamate) is disguised as many  things; look for these common aliases: Monopotassium glutamate Autolysed yeast Hydrolysed protein Sodium caseinate "flavorings" "all natural preservatives" Nutrasweet   Avoid all other foods that convincingly provoke headaches.   Resources: The Dizzy [place]air Laundry Your Headache Diet, migrainestrong.com  https://zamora-andrews.com/   Caffeine and Migraine For patients that have migraine, caffeine intake more than 3 days per week can lead to dependency and increased migraine frequency. I would recommend cutting back on your caffeine intake as best you can. The recommended amount of caffeine is 200-300 mg daily, although migraine patients may experience dependency at even lower doses. While you may notice an increase in headache temporarily, cutting back will be helpful for headaches in the long run. For more information on caffeine and migraine, visit: https://americanmigrainefoundation.org/resource-library/caffeine-and-migraine/   Headache Prevention Strategies:   1. Maintain a headache diary; learn to identify and avoid triggers.  - This can be a simple note where you log when you had a headache, associated symptoms, and medications used - There are several smartphone apps developed to help track migraines: Migraine Buddy, Migraine Monitor, Curelator N1-Headache App   Common triggers include: Emotional triggers: Emotional/Upset family or friends Emotional/Upset occupation Business reversal/success Anticipation anxiety Crisis-serious Post-crisis periodNew job/position   Physical triggers: Vacation Day Weekend Strenuous Exercise High Altitude Location New Move Menstrual Day Physical  Illness Oversleep/Not enough sleep Weather changes Light: Photophobia or light sesnitivity treatment involves a balance between desensitization and reduction in overly strong input. Use dark polarized glasses outside, but not inside. Avoid bright or  fluorescent light, but do not dim environment to the point that going into a normally lit room hurts. Consider FL-41 tint lenses, which reduce the most irritating wavelengths without blocking too much light.  These can be obtained at axonoptics.com or theraspecs.com Foods: see list above.   2. Limit use of acute treatments (over-the-counter medications, triptans, etc.) to no more than 2 days per week or 10 days per month to prevent medication overuse headache (rebound headache).     3. Follow a regular schedule (including weekends and holidays): Don't skip meals. Eat a balanced diet. 8 hours of sleep nightly. Minimize stress. Exercise 30 minutes per day. Being overweight is associated with a 5 times increased risk of chronic migraine. Keep well hydrated and drink 6-8 glasses of water per day.   4. Initiate non-pharmacologic measures at the earliest onset of your headache. Rest and quiet environment. Relax and reduce stress. Breathe2Relax is a free app that can instruct you on    some simple relaxtion and breathing techniques. Http://Dawnbuse.com is a    free website that provides teaching videos on relaxation.  Also, there are  many apps that   can be downloaded for "mindful" relaxation.  An app called YOGA NIDRA will help walk you through mindfulness. Another app called Calm can be downloaded to give you a structured mindfulness guide with daily reminders and skill development. Headspace for guided meditation Mindfulness Based Stress Reduction Online Course: www.palousemindfulness.com Cold compresses.   5. Don't wait!! Take the maximum allowable dosage of prescribed medication at the first sign of migraine.   6. Compliance:  Take prescribed medication regularly as directed and at the first sign of a migraine.   7. Communicate:  Call your physician when problems arise, especially if your headaches change, increase in frequency/severity, or become associated with neurological symptoms (weakness,  numbness, slurred speech, etc.). Proceed to emergency room if you experience new or worsening symptoms or symptoms do not resolve, if you have new neurologic symptoms or if headache is severe, or for any concerning symptom.   8. Headache/pain management therapies: Consider various complementary methods, including medication, behavioral therapy, psychological counselling, biofeedback, massage therapy, acupuncture, dry needling, and other modalities.  Such measures may reduce the need for medications. Counseling for pain management, where patients learn to function and ignore/minimize their pain, seems to work very well.   9. Recommend changing family's attention and focus away from patient's headaches. Instead, emphasize daily activities. If first question of day is 'How are your headaches/Do you have a headache today?', then patient will constantly think about headaches, thus making them worse. Goal is to re-direct attention away from headaches, toward daily activities and other distractions.   10. Helpful Websites: www.AmericanHeadacheSociety.org PatentHood.ch www.headaches.org TightMarket.nl www.achenet.org

## 2023-11-02 NOTE — Progress Notes (Signed)
Chief Complaint  Patient presents with   Room 1    Pt is here Alone. Pt states that her feet are numb and there is like a band around her feet. Pt states she would like to discuss treatment options. Pt states that she will have a pinch in her back that gives he pain.     HISTORY OF PRESENT ILLNESS:  11/02/23 ALL:  Julia Lee is a 73 y.o. female here today for follow up for migraines. She was last seen by Dr Delena Bali 11/2022 and reported migraines were well managed on sumatriptan nasal spray PRN. Since, she reports doing well. Rare migraines. May a handful a year. Sumatriptan works well when needed. She does report waxing and waning numbness/tightness of her feet. Right may be worse than left. No pain. No difficulty with balance. She does have chronic back pain. She is very sensitive to side effects of meds. Followed by psychiatry on lithium.    HISTORY (copied from Dr Quentin Mulling previous note)  73 year old female with a history of bipolar disorder and anxiety who follows in clinic for left-sided headaches. MRI brain 01/24/22 mild generalized cortical atrophy and minimal microvascular ischemic changes.    At her last visit she was continued on Emgality and Zomig for migraines. EMG was ordered to assess for neuropathy.   Interval History: Headaches have been well-controlled since her last visit. She stopped Emgality and has not had any migraines for the past 5 months. Zomig was too expensive so she was switched to Imitrex. Has not needed Imitrex for several months, and will usually take ibuprofen for rescue if she has a mild headache. Her hyperparathyroidism and hypercalcemia is also better controlled, which she thinks has helped her headaches.   EMG was not done as her feet paresthesias improved without treatment.   Migraine days per month: 0 Headache free days per month: 30   Current Headache Regimen: Preventative: none Abortive: Zomig     Prior Therapies                                   Preventive: Topamax - cognitive changes Depakote - drowsiness Gabapentin - mood changes Nadolol 20 mg daily Emgality   Rescue: Zomig nasal spray 5 mg PRN Amerge 1 mg PRN Imitrex ibuprofen   REVIEW OF SYSTEMS: Out of a complete 14 system review of symptoms, the patient complains only of the following symptoms, headaches, numbness of bilateral feet and all other reviewed systems are negative.   ALLERGIES: Allergies  Allergen Reactions   Divalproex Sodium Other (See Comments)    sluggish   Oxycodone Nausea Only   Topamax Other (See Comments)    Memory loss   Gabapentin Other (See Comments)    Low mood, intense sadness     HOME MEDICATIONS: Outpatient Medications Prior to Visit  Medication Sig Dispense Refill   aspirin EC 325 MG tablet Take 650 mg by mouth as needed.     ibuprofen (ADVIL) 100 MG tablet Take 100 mg by mouth as needed for fever.     lithium carbonate 300 MG capsule TAKE 1 CAPSULE BY MOUTH AT NIGHT (Patient taking differently: Take 300 mg by mouth 2 (two) times daily with a meal. TAKE 1 CAPSULE BY MOUTH AT NIGHT) 90 capsule 0   magnesium oxide (MAG-OX) 400 MG tablet Take 400 mg by mouth daily.     Omega-3 Fatty Acids (FISH OIL)  1000 MG CAPS Take 1 capsule by mouth daily.     Zoledronic Acid (RECLAST IV) Inject 1 Dose into the vein as directed. Receives infusion annually     SUMAtriptan (IMITREX) 20 MG/ACT nasal spray Place 1 spray (20 mg total) into the nose as needed for migraine or headache. May repeat dose in 2 hours if needed 10 each 6   doxycycline (VIBRA-TABS) 100 MG tablet Take 1 tablet (100 mg total) by mouth 2 (two) times daily. (Patient not taking: Reported on 11/02/2023) 14 tablet 0   mupirocin ointment (BACTROBAN) 2 % Apply 1 Application topically 2 (two) times daily. (Patient not taking: Reported on 11/02/2023) 22 g 0   No facility-administered medications prior to visit.     PAST MEDICAL HISTORY: Past Medical History:  Diagnosis Date    Anxiety    Bipolar disorder (HCC)    Cancer (HCC)    rt arm squamous cell ca   History of migraine headaches    History of recurrent UTIs    Migraines    Osteoporosis      PAST SURGICAL HISTORY: Past Surgical History:  Procedure Laterality Date   COLONOSCOPY     ROTATOR CUFF REPAIR Left    SKIN CANCER EXCISION     TOE SURGERY       FAMILY HISTORY: Family History  Problem Relation Age of Onset   Pancreatic cancer Mother    Heart disease Father    Suicidality Sister    Breast cancer Maternal Grandmother    Diabetes Maternal Grandmother    Colon cancer Neg Hx    Colon polyps Neg Hx    Esophageal cancer Neg Hx    Stomach cancer Neg Hx    Rectal cancer Neg Hx    Thyroid disease Neg Hx      SOCIAL HISTORY: Social History   Socioeconomic History   Marital status: Married    Spouse name: Not on file   Number of children: 1   Years of education: Medical laboratory scientific officer   Highest education level: Master's degree (e.g., MA, MS, MEng, MEd, MSW, MBA)  Occupational History   Occupation: middle Programmer, multimedia    Comment: retired  Tobacco Use   Smoking status: Never   Smokeless tobacco: Never  Vaping Use   Vaping status: Never Used  Substance and Sexual Activity   Alcohol use: Yes    Alcohol/week: 14.0 standard drinks of alcohol    Types: 14 Glasses of wine per week    Comment: 1 glasses of white wine a night   Drug use: No   Sexual activity: Not on file  Other Topics Concern   Not on file  Social History Narrative   Left handed   Caffeine use: 2 cups per day   Social Drivers of Health   Financial Resource Strain: Low Risk  (10/24/2023)   Overall Financial Resource Strain (CARDIA)    Difficulty of Paying Living Expenses: Not hard at all  Food Insecurity: No Food Insecurity (10/24/2023)   Hunger Vital Sign    Worried About Running Out of Food in the Last Year: Never true    Ran Out of Food in the Last Year: Never true  Transportation Needs: No Transportation Needs  (10/24/2023)   PRAPARE - Administrator, Civil Service (Medical): No    Lack of Transportation (Non-Medical): No  Physical Activity: Sufficiently Active (10/24/2023)   Exercise Vital Sign    Days of Exercise per Week: 5 days    Minutes  of Exercise per Session: 40 min  Stress: No Stress Concern Present (10/24/2023)   [employer] of Occupational Health - Occupational Stress Questionnaire    Feeling of Stress : Not at all  Social Connections: Unknown (10/24/2023)   Social Connection and Isolation Panel [NHANES]    Frequency of Communication with Friends and Family: Three times a week    Frequency of Social Gatherings with Friends and Family: Once a week    Attends Religious Services: Patient declined    Database administrator or Organizations: Yes    Attends Engineer, structural: More than 4 times per year    Marital Status: Married  Catering manager Violence: Not on file     PHYSICAL EXAM  Vitals:   11/02/23 1341  BP: (!) 167/111  Pulse: (!) 110  Weight: 135 lb 8 oz (61.5 kg)  Height: 5\' 3"  (1.6 m)   Body mass index is 24 kg/m.  Generalized: Well developed, in no acute distress  Cardiology: normal rate and rhythm, no murmur auscultated  Respiratory: clear to auscultation bilaterally    Neurological examination  Mentation: Alert oriented to time, place, history taking. Follows all commands speech and language fluent Cranial nerve II-XII: Pupils were equal round reactive to light. Extraocular movements were full, visual field were full on confrontational test. Facial sensation and strength were normal. Uvula tongue midline. Head turning and shoulder shrug  were normal and symmetric. Motor: The motor testing reveals 5 over 5 strength of all 4 extremities. Good symmetric motor tone is noted throughout.  Sensory: Sensory testing is intact to soft touch on all 4 extremities with exception of decreased vibratory sensation of right ankle and great toe. No  evidence of extinction is noted.  Coordination: Cerebellar testing reveals good finger-nose-finger and heel-to-shin bilaterally.  Gait and station: Gait is normal.  Reflexes: Deep tendon reflexes are symmetric and normal bilaterally.    DIAGNOSTIC DATA (LABS, IMAGING, TESTING) - I reviewed patient records, labs, notes, testing and imaging myself where available.  Lab Results  Component Value Date   WBC 7.5 11/13/2021   HGB 14.5 11/13/2021   HCT 43.3 11/13/2021   MCV 97.7 11/13/2021   PLT 301.0 11/13/2021      Component Value Date/Time   NA 136 12/24/2021 1541   K 5.3 12/24/2021 1541   CL 104 12/24/2021 1541   CO2 24 12/24/2021 1541   GLUCOSE 104 (H) 12/24/2021 1541   BUN 21 12/24/2021 1541   CREATININE 0.99 12/24/2021 1541   CALCIUM 11.1 (H) 12/24/2021 1541   PROT 6.5 04/28/2022 1138   ALBUMIN 4.7 11/13/2021 1027   AST 11 12/24/2021 1541   ALT 11 12/24/2021 1541   ALKPHOS 26 (L) 11/13/2021 1027   BILITOT 0.4 12/24/2021 1541   GFRNONAA 65 02/17/2021 0941   GFRAA 76 02/17/2021 0941   Lab Results  Component Value Date   CHOL 182 03/12/2022   HDL 75 03/12/2022   LDLCALC 91 03/12/2022   TRIG 73 03/12/2022   CHOLHDL 2.4 03/12/2022   Lab Results  Component Value Date   HGBA1C 5.0 04/28/2022   Lab Results  Component Value Date   VITAMINB12 1,342 (H) 04/28/2022   Lab Results  Component Value Date   TSH 1.54 11/13/2021        No data to display               No data to display           ASSESSMENT AND PLAN  73 y.o. year old female  has a past medical history of Anxiety, Bipolar disorder (HCC), Cancer (HCC), History of migraine headaches, History of recurrent UTIs, Migraines, and Osteoporosis. here with    Chronic migraine without aura without status migrainosus, not intractable  Neuropathy   Julia Lee reports headaches are well managed. We will continue sumatriptan as needed. We have discussed concerns of bilateral foot numbness.  History and exam most consistent with neuropathy. We have reviewed work up and medication options. She declines at this time. May consider alpha lipoic acid OTC. Healthy lifestyle habits encouraged. She will follow up with PCP as directed. She will return to see me in 1 year if refills are needed. May follow up as needed if PCP willing to refill sumatriptan. She verbalizes understanding and agreement with this plan.    No orders of the defined types were placed in this encounter.    Meds ordered this encounter  Medications   SUMAtriptan (IMITREX) 20 MG/ACT nasal spray    Sig: Place 1 spray (20 mg total) into the nose as needed for migraine or headache. May repeat dose in 2 hours if needed    Dispense:  10 each    Refill:  6    Supervising Provider:   Anson Fret [[id number]]    I spent 30 minutes of face-to-face and non-face-to-face time with patient.  This included previsit chart review, lab review, study review, order entry, electronic health record documentation, patient education.   Shawnie Dapper, MSN, FNP-C 11/02/2023, 2:10 PM  [facility] [address], [address] [place], [place] [address] [phone]

## 2023-11-07 DIAGNOSIS — Z5181 Encounter for therapeutic drug level monitoring: Secondary | ICD-10-CM | POA: Diagnosis not present

## 2023-11-07 DIAGNOSIS — R7889 Finding of other specified substances, not normally found in blood: Secondary | ICD-10-CM | POA: Diagnosis not present

## 2023-11-08 ENCOUNTER — Encounter (HOSPITAL_BASED_OUTPATIENT_CLINIC_OR_DEPARTMENT_OTHER): Payer: Medicare PPO | Admitting: Internal Medicine

## 2023-11-08 DIAGNOSIS — I87311 Chronic venous hypertension (idiopathic) with ulcer of right lower extremity: Secondary | ICD-10-CM | POA: Diagnosis not present

## 2023-11-08 DIAGNOSIS — L97812 Non-pressure chronic ulcer of other part of right lower leg with fat layer exposed: Secondary | ICD-10-CM

## 2023-11-09 ENCOUNTER — Ambulatory Visit (HOSPITAL_BASED_OUTPATIENT_CLINIC_OR_DEPARTMENT_OTHER): Payer: Medicare PPO | Admitting: Internal Medicine

## 2023-11-28 DIAGNOSIS — M810 Age-related osteoporosis without current pathological fracture: Secondary | ICD-10-CM | POA: Diagnosis not present

## 2023-11-28 DIAGNOSIS — E210 Primary hyperparathyroidism: Secondary | ICD-10-CM | POA: Diagnosis not present

## 2023-12-06 ENCOUNTER — Ambulatory Visit: Payer: Medicare PPO | Admitting: Psychiatry

## 2023-12-12 DIAGNOSIS — M810 Age-related osteoporosis without current pathological fracture: Secondary | ICD-10-CM | POA: Diagnosis not present

## 2023-12-29 ENCOUNTER — Encounter: Payer: Self-pay | Admitting: Internal Medicine

## 2024-01-02 ENCOUNTER — Other Ambulatory Visit

## 2024-01-02 DIAGNOSIS — G0439 Other acute necrotizing hemorrhagic encephalopathy: Secondary | ICD-10-CM | POA: Diagnosis not present

## 2024-01-02 DIAGNOSIS — E876 Hypokalemia: Secondary | ICD-10-CM

## 2024-01-02 DIAGNOSIS — Z0000 Encounter for general adult medical examination without abnormal findings: Secondary | ICD-10-CM | POA: Diagnosis not present

## 2024-01-02 NOTE — Progress Notes (Signed)
 Lab only

## 2024-01-03 ENCOUNTER — Encounter: Payer: Self-pay | Admitting: Internal Medicine

## 2024-01-03 ENCOUNTER — Ambulatory Visit (INDEPENDENT_AMBULATORY_CARE_PROVIDER_SITE_OTHER): Payer: Medicare PPO | Admitting: Internal Medicine

## 2024-01-03 VITALS — BP 138/88 | HR 72 | Temp 98.4°F | Resp 12 | Ht 63.0 in | Wt 134.8 lb

## 2024-01-03 DIAGNOSIS — Z0000 Encounter for general adult medical examination without abnormal findings: Secondary | ICD-10-CM | POA: Diagnosis not present

## 2024-01-03 DIAGNOSIS — H903 Sensorineural hearing loss, bilateral: Secondary | ICD-10-CM | POA: Diagnosis not present

## 2024-01-03 DIAGNOSIS — M810 Age-related osteoporosis without current pathological fracture: Secondary | ICD-10-CM | POA: Diagnosis not present

## 2024-01-03 DIAGNOSIS — H900 Conductive hearing loss, bilateral: Secondary | ICD-10-CM | POA: Diagnosis not present

## 2024-01-03 DIAGNOSIS — Z8669 Personal history of other diseases of the nervous system and sense organs: Secondary | ICD-10-CM

## 2024-01-03 DIAGNOSIS — Z8639 Personal history of other endocrine, nutritional and metabolic disease: Secondary | ICD-10-CM

## 2024-01-03 DIAGNOSIS — G4700 Insomnia, unspecified: Secondary | ICD-10-CM | POA: Diagnosis not present

## 2024-01-03 LAB — CBC WITH DIFFERENTIAL/PLATELET
Absolute Lymphocytes: 1955 {cells}/uL (ref 850–3900)
Absolute Monocytes: 626 {cells}/uL (ref 200–950)
Basophils Absolute: 93 {cells}/uL (ref 0–200)
Basophils Relative: 1.6 %
Eosinophils Absolute: 400 {cells}/uL (ref 15–500)
Eosinophils Relative: 6.9 %
HCT: 40.8 % (ref 35.0–45.0)
Hemoglobin: 13.7 g/dL (ref 11.7–15.5)
MCH: 31.8 pg (ref 27.0–33.0)
MCHC: 33.6 g/dL (ref 32.0–36.0)
MCV: 94.7 fL (ref 80.0–100.0)
MPV: 10.8 fL (ref 7.5–12.5)
Monocytes Relative: 10.8 %
Neutro Abs: 2726 {cells}/uL (ref 1500–7800)
Neutrophils Relative %: 47 %
Platelets: 309 10*3/uL (ref 140–400)
RBC: 4.31 10*6/uL (ref 3.80–5.10)
RDW: 11.8 % (ref 11.0–15.0)
Total Lymphocyte: 33.7 %
WBC: 5.8 10*3/uL (ref 3.8–10.8)

## 2024-01-03 LAB — POCT URINALYSIS DIP (MANUAL ENTRY)
Bilirubin, UA: NEGATIVE
Blood, UA: NEGATIVE
Glucose, UA: NEGATIVE mg/dL
Ketones, POC UA: NEGATIVE mg/dL
Leukocytes, UA: NEGATIVE
Nitrite, UA: NEGATIVE
Protein Ur, POC: NEGATIVE mg/dL
Spec Grav, UA: 1.01 (ref 1.010–1.025)
Urobilinogen, UA: 0.2 U/dL
pH, UA: 6 (ref 5.0–8.0)

## 2024-01-03 LAB — COMPLETE METABOLIC PANEL WITH GFR
AG Ratio: 2 (calc) (ref 1.0–2.5)
ALT: 13 U/L (ref 6–29)
AST: 14 U/L (ref 10–35)
Albumin: 4.3 g/dL (ref 3.6–5.1)
Alkaline phosphatase (APISO): 26 U/L — ABNORMAL LOW (ref 37–153)
BUN: 18 mg/dL (ref 7–25)
CO2: 24 mmol/L (ref 20–32)
Calcium: 10.2 mg/dL (ref 8.6–10.4)
Chloride: 103 mmol/L (ref 98–110)
Creat: 0.85 mg/dL (ref 0.60–1.00)
Globulin: 2.1 g/dL (ref 1.9–3.7)
Glucose, Bld: 101 mg/dL — ABNORMAL HIGH (ref 65–99)
Potassium: 5.2 mmol/L (ref 3.5–5.3)
Sodium: 134 mmol/L — ABNORMAL LOW (ref 135–146)
Total Bilirubin: 0.6 mg/dL (ref 0.2–1.2)
Total Protein: 6.4 g/dL (ref 6.1–8.1)

## 2024-01-03 LAB — LIPID PANEL
Cholesterol: 196 mg/dL (ref <200)
HDL: 66 mg/dL (ref >=50)
LDL Cholesterol (Calc): 111 mg/dL — ABNORMAL HIGH
Non-HDL Cholesterol (Calc): 130 mg/dL — ABNORMAL HIGH (ref <130)
Total CHOL/HDL Ratio: 3 (calc) (ref <5.0)
Triglycerides: 89 mg/dL (ref <150)
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
## 2024-01-04 ENCOUNTER — Encounter: Payer: Self-pay | Admitting: Internal Medicine

## 2024-01-04 NOTE — Patient Instructions (Signed)
 It was a pleasure to see you today. I am glad you are doing so well. Return in one year or as needed.

## 2024-01-09 DIAGNOSIS — D692 Other nonthrombocytopenic purpura: Secondary | ICD-10-CM | POA: Diagnosis not present

## 2024-01-09 DIAGNOSIS — L570 Actinic keratosis: Secondary | ICD-10-CM | POA: Diagnosis not present

## 2024-01-09 DIAGNOSIS — Z85828 Personal history of other malignant neoplasm of skin: Secondary | ICD-10-CM | POA: Diagnosis not present

## 2024-01-09 DIAGNOSIS — L821 Other seborrheic keratosis: Secondary | ICD-10-CM | POA: Diagnosis not present

## 2024-01-09 DIAGNOSIS — L565 Disseminated superficial actinic porokeratosis (DSAP): Secondary | ICD-10-CM | POA: Diagnosis not present

## 2024-01-12 ENCOUNTER — Ambulatory Visit: Admitting: Internal Medicine

## 2024-01-12 ENCOUNTER — Encounter: Payer: Self-pay | Admitting: Internal Medicine

## 2024-01-12 VITALS — BP 138/80 | HR 74 | Temp 97.0°F | Ht 63.0 in | Wt 130.0 lb

## 2024-01-12 DIAGNOSIS — R197 Diarrhea, unspecified: Secondary | ICD-10-CM | POA: Diagnosis not present

## 2024-01-12 MED ORDER — METRONIDAZOLE 500 MG PO TABS: 500.0000 mg | ORAL_TABLET | Freq: Two times a day (BID) | ORAL | 0 refills | Status: AC

## 2024-01-12 MED ORDER — CIPROFLOXACIN HCL 500 MG PO TABS: 500.0000 mg | ORAL_TABLET | Freq: Two times a day (BID) | ORAL | 0 refills | Status: AC

## 2024-01-12 MED ORDER — ONDANSETRON HCL 4 MG PO TABS: 4.0000 mg | ORAL_TABLET | Freq: Three times a day (TID) | ORAL | 0 refills | Status: DC | PRN

## 2024-01-12 NOTE — Progress Notes (Signed)
 Patient Care Team: Margaree Mackintosh, MD as PCP - General (Internal Medicine)  Visit Date: 01/12/24  Subjective:   Chief Complaint  Patient presents with   GI Problem    Long term diarrhea pt states this all started about 4 weeks ago and she also had raw fish that she may believe could have either upset her stomach and anything spicy also triggers her stomach as well. Possible food poison or stomach ulcer. Pt also has a burning sensation after certain  foods as well   Patient VH:QIONGEX Shyan, Scalisi DOB:[birth year],73 y.o. BMW:[id number]   73 y.o. Female presents today for acute visit with Diarrhea. She says that for the past 4 weeks she has had diarrhea intermittently, nausea, and is having some abdominal pain. Says that she has been eating a fairly bland diet and it will temporarily be relieved with Imodium. Does note that she did have some raw fish, which may have contributed to to this exacerbation. Denies recent travel. Her recent CBC was normal.  Past Medical History:  Diagnosis Date   Anxiety    Bipolar disorder (HCC)    Cancer (HCC)    rt arm squamous cell ca   History of migraine headaches    History of recurrent UTIs    Migraines    Osteoporosis     Allergies  Allergen Reactions   Divalproex Sodium Other (See Comments)    sluggish   Oxycodone Nausea Only   Topamax Other (See Comments)    Memory loss   Gabapentin Other (See Comments)    Low mood, intense sadness    Family History  Problem Relation Age of Onset   Pancreatic cancer Mother    Heart disease Father    Suicidality Sister    Breast cancer Maternal Grandmother    Diabetes Maternal Grandmother    Colon cancer Neg Hx    Colon polyps Neg Hx    Esophageal cancer Neg Hx    Stomach cancer Neg Hx    Rectal cancer Neg Hx    Thyroid disease Neg Hx    Social History   Social History Narrative   Left handed   Caffeine use: 2 cups per day   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 Patient and her husband generally spend the summer in [place]. She does not smoke and seldom consumes alcohol. She has 1 daughter, divorced, who resides in [place] and has 1 grandson.    2025 - Grandson in 71 years old now.    Review of Systems  Constitutional:  Negative for chills and fever.  Gastrointestinal:  Positive for abdominal pain, diarrhea and nausea. Negative for blood in stool.     Objective:  Vitals: BP 138/80 (BP Location: Left Arm, Patient Position: Sitting, Cuff Size: Normal)   Pulse 74   Temp (!) 97 F (36.1 C) (Oral)   Ht 5\' 3"  (1.6 m)   Wt 130 lb (59 kg)   SpO2 99%   BMI 23.03 kg/m   Physical Exam Vitals and nursing note reviewed.  Constitutional:      General: She is not in acute distress.    Appearance: Normal appearance. She is not toxic-appearing.  HENT:     Head: Normocephalic and atraumatic.  Pulmonary:     Effort: Pulmonary effort is normal.  Abdominal:     Palpations: Abdomen is soft.  Tenderness: There is abdominal tenderness.  Skin:    General: Skin is warm and dry.  Neurological:     Mental Status: She is alert and oriented to person, place, and time. Mental status is at baseline.  Psychiatric:        Mood and Affect: Mood normal.        Behavior: Behavior normal.        Thought Content: Thought content normal.        Judgment: Judgment normal.     Results:  Studies Obtained And Personally Reviewed By Me: Labs:     Component Value Date/Time   NA 134 (L) 01/02/2024 0919   K 5.2 01/02/2024 0919   CL 103 01/02/2024 0919   CO2 24 01/02/2024 0919   GLUCOSE 101 (H) 01/02/2024 0919   BUN 18 01/02/2024 0919   CREATININE 0.85 01/02/2024 0919   CALCIUM 10.2 01/02/2024 0919   PROT 6.4 01/02/2024 0919   PROT 6.5 04/28/2022 1138   ALBUMIN 4.7 11/13/2021 1027   AST 14 01/02/2024 0919   ALT 13 01/02/2024 0919   ALKPHOS 26 (L)  11/13/2021 1027   BILITOT 0.6 01/02/2024 0919   GFRNONAA 65 02/17/2021 0941   GFRAA 76 02/17/2021 0941    Lab Results  Component Value Date   WBC 5.8 01/02/2024   HGB 13.7 01/02/2024   HCT 40.8 01/02/2024   MCV 94.7 01/02/2024   PLT 309 01/02/2024   Lab Results  Component Value Date   CHOL 196 01/02/2024   HDL 66 01/02/2024   LDLCALC 111 (H) 01/02/2024   TRIG 89 01/02/2024   CHOLHDL 3.0 01/02/2024   Lab Results  Component Value Date   HGBA1C 5.0 04/28/2022    Lab Results  Component Value Date   TSH 1.54 11/13/2021   Assessment & Plan:   Diarrhea, Presumed Infectious Origin: 500 mg Cipro - take 1 tablet (500 mg total) by mouth 2 (two) times daily for 10 days and Flagyl 500 mg - take 1 tablet (500 mg total) by mouth 2 (two) times daily for 7 days. If does not improve after 10 days may consider GI or Infectious Disease referral.  For nausea, have prescribed Zofran 4 mg one tab every 8 hours as needed for nausea. Pt says she has 10 days of Cipro and 7 days of Flagyl.  I,Emily Lagle,acting as a Neurosurgeon for Margaree Mackintosh, MD.,have documented all relevant documentation on the behalf of Margaree Mackintosh, MD,as directed by  Margaree Mackintosh, MD while in the presence of Margaree Mackintosh, MD.   I, Margaree Mackintosh, MD, have reviewed all documentation for this visit. The documentation on 01/12/24 for the exam, diagnosis, procedures, and orders are all accurate and complete.

## 2024-01-20 ENCOUNTER — Telehealth: Payer: Self-pay | Admitting: Gastroenterology

## 2024-01-20 NOTE — Telephone Encounter (Signed)
 Inbound call from patient stating that for the last 4 weeks she has experiencing diarrhea. Patient has been scheduled for 5/15 at 9:20 with Celso Amy and is requesting a call back to discuss her symptoms and to see if she can be seen sooner. Please advise.

## 2024-01-23 NOTE — Telephone Encounter (Signed)
 Contacted patient who states that she has had around 4 weeks watery diarrhea which seemed pretty intense on Friday evening at which point she decided to call our office for more emergent appointment. Patient says that she saw her PCP recently for the diarrhea who ordered Cipro x 10 days and Flagyl x 7 days should the Cipro be ineffective. Patient has taken the full 10 day course of Cipro at this time. She states that over the weekend, she began having normal bowel movements again and no longer feels she needs emergent appointment to see GI. She is scheduled for 02/23/24 appointment with Brigitte Canard, PA-C and says she will keep this appointment. Advised patient that should her symptoms significantly worsen before 5/15 appointment, she should reach back out. She verbalizes understanding and thanked me for the call.

## 2024-01-25 NOTE — Telephone Encounter (Signed)
 Inbound call from patient stating she was told to call back if her symptoms did not subside to call back. Patient states she is still cramping and still having watery diarrhea and is requesting a call back from the nurse to discuss next steps. Please advise.

## 2024-01-25 NOTE — Telephone Encounter (Signed)
 Patient states that she has began having watery diarrhea again. She would like sooner appointment. She is scheduled to see Everett Hitt, NP on 02/09/24. States she still has a course of flagyl that she will begin today and she will cancel 02/09/24 appointment if the alleviates her symptoms.

## 2024-01-25 NOTE — Telephone Encounter (Signed)
 Patient returning phone call. Please advise.

## 2024-01-25 NOTE — Telephone Encounter (Signed)
 Left message for patient to call back

## 2024-02-03 DIAGNOSIS — R7889 Finding of other specified substances, not normally found in blood: Secondary | ICD-10-CM | POA: Diagnosis not present

## 2024-02-03 DIAGNOSIS — Z5181 Encounter for therapeutic drug level monitoring: Secondary | ICD-10-CM | POA: Diagnosis not present

## 2024-02-09 ENCOUNTER — Other Ambulatory Visit (INDEPENDENT_AMBULATORY_CARE_PROVIDER_SITE_OTHER)

## 2024-02-09 ENCOUNTER — Ambulatory Visit: Admitting: Nurse Practitioner

## 2024-02-09 ENCOUNTER — Encounter: Payer: Self-pay | Admitting: Nurse Practitioner

## 2024-02-09 VITALS — BP 110/74 | HR 72 | Ht 63.0 in | Wt 129.0 lb

## 2024-02-09 DIAGNOSIS — R109 Unspecified abdominal pain: Secondary | ICD-10-CM

## 2024-02-09 DIAGNOSIS — R197 Diarrhea, unspecified: Secondary | ICD-10-CM | POA: Diagnosis not present

## 2024-02-09 DIAGNOSIS — R194 Change in bowel habit: Secondary | ICD-10-CM | POA: Diagnosis not present

## 2024-02-09 LAB — SEDIMENTATION RATE: Sed Rate: 3 mm/h (ref 0–30)

## 2024-02-09 LAB — C-REACTIVE PROTEIN: CRP: <1 mg/dL (ref 0.5–20.0)

## 2024-02-09 NOTE — Progress Notes (Unsigned)
     02/09/2024 Julia Lee [id number] 1950-11-08   Chief Complaint:  History of Present Illness: Julia Lee is a 73 year old female with a past medical history of anxiety, depression, bipolar disorder, migraine headaches, chronic left hand tremor and osteoporosis.   She has bad anxiety, 2 years ago, lost weight, +hyperparathyroidism a contributing.   For the past 2 1/2 months intermittent diarrhea and cramping. Gets better than recurs. She saw her PCP, treated cipro  bid x 10 days, then prescribed Flagyl  bid 7 days. Coming off the Flagyl , had solid stools. Solid then sausage like stool 3 to 4 times daily with watery diarrhea. Some mornings normal stool then 2 hours sausage then watery.   She questions about  She makes her own Kefir, uses soy milk. Husband made cheese sauce.   Today series of solid to watery. Cramping, lower abdominal cramping.   Sometimes has chest pain Chest pain without exertion  Randomly  Father died 23 MI. Mother died from pancreatic cancer age 61.    EGD 11/17/2022 - Esophagogastric landmarks identified.  - Normal esophagus otherwise -  no inflammatory changes. Empirically dilated to 17mm with good result.  - Normal stomach. Biopsied to rule out H pylori.  - Normal duodenal bulb and second portion of the duodenum. Biopsied to rule out celiac disease.   Colonoscopy 03/01/2018: - Diverticulosis in the sigmoid colon.  - Severely tortous colon.  - The examination was otherwise normal on direct and retroflexion views.  - No polyps - Recall colonoscopy 10 years    Current Medications, Allergies, Past Medical History, Past Surgical History, Family History and Social History were reviewed in [employer] record.   Review of Systems:   Constitutional: Negative for fever, sweats, chills or weight loss.  Respiratory: Negative for shortness of breath.   Cardiovascular: Negative for chest pain, palpitations and leg swelling.   Gastrointestinal: See HPI.  Musculoskeletal: Negative for back pain or muscle aches.  Neurological: Negative for dizziness, headaches or paresthesias.    Physical Exam: There were no vitals taken for this visit. BP 110/74   Pulse 72   Ht 5\' 3"  (1.6 m)   Wt 129 lb (58.5 kg)   BMI 22.85 kg/m   Wt Readings from Last 3 Encounters:  02/09/24 129 lb (58.5 kg)  01/12/24 130 lb (59 kg)  01/03/24 134 lb 12.8 oz (61.1 kg)     General: in no acute distress. Head: Normocephalic and atraumatic. Eyes: No scleral icterus. Conjunctiva pink . Ears: Normal auditory acuity. Mouth: Dentition intact. No ulcers or lesions.  Lungs: Clear throughout to auscultation. Heart: Regular rate and rhythm, no murmur. Abdomen: Soft, nontender and nondistended. No masses or hepatomegaly. Normal bowel sounds x 4 quadrants.  Rectal: Deferred.  Musculoskeletal: Symmetrical with no gross deformities. Extremities: No edema. Neurological: Alert oriented x 4. No focal deficits.  Psychological: Alert and cooperative. Normal mood and affect  Assessment and Recommendations: ***

## 2024-02-09 NOTE — Patient Instructions (Addendum)
 Your provider has requested that you go to the basement level for lab work before leaving today. Press "B" on the elevator. The lab is located at the first door on the left as you exit the elevator.  IBGuard- take 1 capsule by mouth twice daily as needed  Lactaid- take 2 tablets with each dairy product  May add fiber supplement of choice daily  Due to recent changes in healthcare laws, you may see the results of your imaging and laboratory studies on MyChart before your provider has had a chance to review them.  We understand that in some cases there may be results that are confusing or concerning to you. Not all laboratory results come back in the same time frame and the provider may be waiting for multiple results in order to interpret others.  Please give us  48 hours in order for your provider to thoroughly review all the results before contacting the office for clarification of your results.   Thank you for trusting me with your gastrointestinal care!   Everett Hitt, CRNP

## 2024-02-11 LAB — FOOD ALLERGY PROFILE

## 2024-02-11 LAB — INTERPRETATION:

## 2024-02-11 LAB — TISSUE TRANSGLUTAMINASE ABS,IGG,IGA
(tTG) Ab, IgA: <1 U/mL
(tTG) Ab, IgG: <1 U/mL

## 2024-02-11 LAB — IGA: Immunoglobulin A: 136 mg/dL (ref 70–320)

## 2024-02-13 NOTE — Progress Notes (Signed)
 Agree with assessment and plan as outlined.

## 2024-02-16 ENCOUNTER — Encounter (HOSPITAL_COMMUNITY): Payer: Self-pay

## 2024-02-17 ENCOUNTER — Other Ambulatory Visit: Payer: Self-pay | Admitting: Medical Genetics

## 2024-02-20 ENCOUNTER — Other Ambulatory Visit: Payer: Self-pay

## 2024-02-23 ENCOUNTER — Ambulatory Visit: Admitting: Physician Assistant

## 2024-02-23 ENCOUNTER — Ambulatory Visit
Admission: RE | Admit: 2024-02-23 | Discharge: 2024-02-23 | Disposition: A | Discharge status: Home / Self Care | Payer: Medicare PPO | Source: Ambulatory Visit | Attending: Internal Medicine | Admitting: Internal Medicine

## 2024-02-23 DIAGNOSIS — Z1231 Encounter for screening mammogram for malignant neoplasm of breast: Secondary | ICD-10-CM

## 2024-02-24 ENCOUNTER — Other Ambulatory Visit

## 2024-02-24 DIAGNOSIS — Z006 Encounter for examination for normal comparison and control in clinical research program: Secondary | ICD-10-CM

## 2024-02-27 NOTE — Telephone Encounter (Signed)
 Inbound call from patient requesting a call to discuss previous note. Please advise, thank you.

## 2024-02-28 ENCOUNTER — Ambulatory Visit: Payer: Self-pay | Admitting: Internal Medicine

## 2024-03-02 LAB — GENECONNECT MOLECULAR SCREEN: Genetic Analysis Overall Interpretation: NEGATIVE

## 2024-03-14 ENCOUNTER — Encounter: Payer: Self-pay | Admitting: Nurse Practitioner

## 2024-03-14 ENCOUNTER — Ambulatory Visit: Admitting: Nurse Practitioner

## 2024-03-14 VITALS — BP 130/78 | HR 71 | Ht 63.0 in | Wt 126.0 lb

## 2024-03-14 DIAGNOSIS — R197 Diarrhea, unspecified: Secondary | ICD-10-CM | POA: Insufficient documentation

## 2024-03-14 DIAGNOSIS — K529 Noninfective gastroenteritis and colitis, unspecified: Secondary | ICD-10-CM

## 2024-03-14 MED ORDER — CHOLESTYRAMINE 4 G PO PACK: 4.0000 g | PACK | Freq: Every day | ORAL | 1 refills | Status: AC

## 2024-03-14 NOTE — Patient Instructions (Signed)
 We have sent the following medications to your pharmacy for you to pick up at your convenience: Cholestyramine 4g packet daily.   Please mix 1 4g packet in 4oz of clear liquids of your choice once daily. DO NOT take this within 4 hours of any other medication.  Your provider would like for you to send a MyChart message in 2-3 weeks with an update on how to Cholestyramine is working for you.  Please follow up sooner if symptoms increase or worsen  Due to recent changes in healthcare laws, you may see the results of your imaging and laboratory studies on MyChart before your provider has had a chance to review them.  We understand that in some cases there may be results that are confusing or concerning to you. Not all laboratory results come back in the same time frame and the provider may be waiting for multiple results in order to interpret others.  Please give us  48 hours in order for your provider to thoroughly review all the results before contacting the office for clarification of your results.   Thank you for trusting me with your gastrointestinal care!   Everett Hitt, NP _______________________________________________________  If your blood pressure at your visit was 140/90 or greater, please contact your primary care physician to follow up on this.  _______________________________________________________  If you are age 60 or older, your body mass index should be between 23-30. Your Body mass index is 22.32 kg/m. If this is out of the aforementioned range listed, please consider follow up with your Primary Care Provider.  If you are age 82 or younger, your body mass index should be between 19-25. Your Body mass index is 22.32 kg/m. If this is out of the aformentioned range listed, please consider follow up with your Primary Care Provider.   ________________________________________________________  The [facility] providers would like to encourage you to use MYCHART to  communicate with providers for non-urgent requests or questions.  Due to long hold times on the telephone, sending your provider a message by [facility] may be a faster and more efficient way to get a response.  Please allow 48 business hours for a response.  Please remember that this is for non-urgent requests.  _______________________________________________________

## 2024-03-14 NOTE — Progress Notes (Signed)
 03/14/2024 CANNIE MUCKLE [id number] September 08, 1951   Chief Complaint: Diarrhea  History of Present Illness: Brittish Bolinger. [place]ec is a 73 year old female with a past medical history of anxiety, depression, bipolar disorder, hyperparathyroidism, migraine headaches, chronic left hand tremor and osteoporosis. She is known by Dr. General Kenner. I last saw her in office 02/09/2024 due to having lower abdominal cramping with intermittent diarrhea x 2 1/2 months. Food allergy  panel was negative. Sed rate 3. CRP < 1. IgA 136. TTG IgA and TTG IgG levels were < 1. She took Ibgard which resulted in anorectal burning so she discontinued it. She is taking Lactaid with all dairy products without noticeable improvement. She took Psyllium which resulted in a stomach ache. She is following a FODMAP diet. She drinks Kefir and high protein powder smoothies most mornings for the past few years. She stopped drinking Kefir smoothies for one week without improvement. She continues to have an altered bowel pattern. She passes pellet like stool in the morning and one hour later passes 1 or 2 nonbloody watery diarrhea bowel movements. She has some abdominal cramping discomfort at time, no significant abdominal pain. Colonoscopy 02/2018 showed diverticulosis and a severely tortuous colon. EGD 11/2021 showed a normal stomach and duodenum without evidence of celiac disease or H. Pylori.  She has lost 4 pounds over the past 1 to 2 months.     Latest Ref Rng & Units 01/02/2024    9:19 AM 11/13/2021   10:27 AM 02/17/2021    9:41 AM  CBC  WBC 3.8 - 10.8 Thousand/uL 5.8  7.5  6.3   Hemoglobin 11.7 - 15.5 g/dL 78.2  95.6  21.3   Hematocrit 35.0 - 45.0 % 40.8  43.3  41.3   Platelets 140 - 400 Thousand/uL 309  301.0  296        Latest Ref Rng & Units 01/02/2024    9:19 AM 04/28/2022   11:38 AM 12/24/2021    3:41 PM  CMP  Glucose 65 - 99 mg/dL 086   578   BUN 7 - 25 mg/dL 18   21   Creatinine 4.69 - 1.00 mg/dL 6.29   5.28   Sodium 413 -  146 mmol/L 134   136   Potassium 3.5 - 5.3 mmol/L 5.2   5.3   Chloride 98 - 110 mmol/L 103   104   CO2 20 - 32 mmol/L 24   24   Calcium 8.6 - 10.4 mg/dL 24.4   01.0   Total Protein 6.1 - 8.1 g/dL 6.4  6.5  6.5   Total Bilirubin 0.2 - 1.2 mg/dL 0.6   0.4   AST 10 - 35 U/L 14   11   ALT 6 - 29 U/L 13   11     PAST GI PROCEDURES:   EGD 11/17/2022 - Esophagogastric landmarks identified.  - Normal esophagus otherwise -  no inflammatory changes. Empirically dilated to 17mm with good result.  - Normal stomach. Biopsied to rule out H pylori.  - Normal duodenal bulb and second portion of the duodenum. Biopsied to rule out celiac disease.  1. Surgical [P], duodenal - DUODENAL MUCOSA WITH NORMAL VILLOUS ARCHITECTURE. - NO VILLOUS ATROPHY OR INCREASED INTRAEPITHELIAL LYMPHOCYTES. 2. Surgical [P], gastric antrum and gastric body - UNREMARKABLE ANTRAL AND OXYNTIC MUCOSA. - NO HELICOBACTER PYLORI IDENTIFIED.   Colonoscopy 03/01/2018: - Diverticulosis in the sigmoid colon. - Severely tortous colon.  - The examination was otherwise normal on direct and retroflexion views.  -  No polyps - Recall colonoscopy 10 years    Current Outpatient Medications on File Prior to Visit  Medication Sig Dispense Refill   aspirin EC 325 MG tablet Take 650 mg by mouth as needed.     clotrimazole (MYCELEX) 10 MG troche Take 10 mg by mouth 3 (three) times daily.     ibuprofen (ADVIL) 100 MG tablet Take 100 mg by mouth as needed for fever.     lithium  carbonate 300 MG capsule TAKE 1 CAPSULE BY MOUTH AT NIGHT (Patient taking differently: Take 300 mg by mouth 2 (two) times daily with a meal. TAKE 1 CAPSULE BY MOUTH AT NIGHT) 90 capsule 0   Omega-3 Fatty Acids (FISH OIL) 1000 MG CAPS Take 1 capsule by mouth daily.     tretinoin (RETIN-A) 0.05 % cream Apply topically at bedtime.     Zoledronic  Acid (RECLAST  IV) Inject 1 Dose into the vein as directed. Receives infusion annually     No current facility-administered  medications on file prior to visit.   Allergies  Allergen Reactions   Divalproex Sodium Other (See Comments)    sluggish   Oxycodone Nausea Only   Topamax Other (See Comments)    Memory loss   Gabapentin Other (See Comments)    Low mood, intense sadness   Current Medications, Allergies, Past Medical History, Past Surgical History, Family History and Social History were reviewed in [employer] record.  Review of Systems:   Constitutional: + Weight loss. Respiratory: Negative for shortness of breath.   Cardiovascular: Negative for chest pain, palpitations and leg swelling.  Gastrointestinal: See HPI.  Musculoskeletal: Negative for back pain or muscle aches.  Neurological: Negative for dizziness, headaches or paresthesias.   Physical Exam: BP 130/78   Pulse 71   Ht 5\' 3"  (1.6 m)   Wt 126 lb (57.2 kg)   BMI 22.32 kg/m   Wt Readings from Last 3 Encounters:  03/14/24 126 lb (57.2 kg)  02/09/24 129 lb (58.5 kg)  01/12/24 130 lb (59 kg)    General: 73 year old female in no acute distress. Head: Normocephalic and atraumatic. Eyes: No scleral icterus. Conjunctiva pink . Ears: Normal auditory acuity. Mouth: Dentition intact. No ulcers or lesions.  Lungs: Clear throughout to auscultation. Heart: Regular rate and rhythm, no murmur. Abdomen: Soft, nontender and nondistended. No masses or hepatomegaly. Normal bowel sounds x 4 quadrants.  Rectal: Deferred. Musculoskeletal: Symmetrical with no gross deformities. Extremities: No edema. Neurological: Alert oriented x 4. No focal deficits.  Psychological: Alert and cooperative. Normal mood and affect  Assessment and Recommendations:  73 year old female with diarrhea x 3 and half months.  Normal CRP and sed rates.  Negative celiac panel.  Negative food allergy  panel.  Psyllium resulted in stomach aches, IBgard caused anal rectal burning discomfort and no significant improvement on Lactaid.  Colonoscopy 03/01/2018  showed a severely tortuous colon and sigmoid diverticulosis. - Patient does not wish to pursue a diagnostic colonoscopy at this time - Cholestyramine 4 g packet mixed in 4 ounces clear liquid of choice once daily not to be taken within 4 hours of any other medication, hold if no BM in 24 hours - Patient to contact me in 3 weeks with an update - Monitor weight  Colon cancer screening.  Colonoscopy 03/01/2018 showed a severely tortuous colon, sigmoid diverticulosis without evidence of colon polyps. - Dr. General Kenner to verify timing of a screening colonoscopy versus Cologuard Test

## 2024-03-15 NOTE — Progress Notes (Signed)
 Agree with assessment plan as outlined.  She had a very difficult colonoscopy on her last exam which showed no polyps.  As such she would not be due again until age 73, given she had no history of colon polyps, I don't think we should pursue further screening colonoscopies given her age and difficulty with last colonoscopy. If she wants to pursue screening at that time otherwise, she can see us  to discuss it further.  Decision to do colonoscopy in the future would be based on her bowel symptoms and if she needs further evaluation of that, if they persist

## 2024-08-10 DIAGNOSIS — F349 Persistent mood [affective] disorder, unspecified: Secondary | ICD-10-CM | POA: Diagnosis not present

## 2024-08-10 DIAGNOSIS — Z5181 Encounter for therapeutic drug level monitoring: Secondary | ICD-10-CM | POA: Diagnosis not present

## 2024-08-13 ENCOUNTER — Encounter: Payer: Self-pay | Admitting: Radiology

## 2024-08-29 DIAGNOSIS — F3170 Bipolar disorder, currently in remission, most recent episode unspecified: Secondary | ICD-10-CM | POA: Diagnosis not present

## 2024-09-05 DIAGNOSIS — L821 Other seborrheic keratosis: Secondary | ICD-10-CM | POA: Diagnosis not present

## 2024-09-05 DIAGNOSIS — D692 Other nonthrombocytopenic purpura: Secondary | ICD-10-CM | POA: Diagnosis not present

## 2024-09-05 DIAGNOSIS — D225 Melanocytic nevi of trunk: Secondary | ICD-10-CM | POA: Diagnosis not present

## 2024-09-05 DIAGNOSIS — L738 Other specified follicular disorders: Secondary | ICD-10-CM | POA: Diagnosis not present

## 2024-09-05 DIAGNOSIS — L814 Other melanin hyperpigmentation: Secondary | ICD-10-CM | POA: Diagnosis not present

## 2024-09-05 DIAGNOSIS — Z85828 Personal history of other malignant neoplasm of skin: Secondary | ICD-10-CM | POA: Diagnosis not present

## 2024-09-05 DIAGNOSIS — L438 Other lichen planus: Secondary | ICD-10-CM | POA: Diagnosis not present

## 2024-09-05 DIAGNOSIS — L570 Actinic keratosis: Secondary | ICD-10-CM | POA: Diagnosis not present

## 2024-09-05 DIAGNOSIS — L565 Disseminated superficial actinic porokeratosis (DSAP): Secondary | ICD-10-CM | POA: Diagnosis not present

## 2024-09-10 ENCOUNTER — Other Ambulatory Visit: Payer: Self-pay

## 2024-09-10 ENCOUNTER — Encounter: Payer: Self-pay | Admitting: Physician Assistant

## 2024-09-10 ENCOUNTER — Ambulatory Visit: Admitting: Physician Assistant

## 2024-09-10 DIAGNOSIS — M25572 Pain in left ankle and joints of left foot: Secondary | ICD-10-CM | POA: Diagnosis not present

## 2024-09-10 MED ORDER — METHYLPREDNISOLONE ACETATE 40 MG/ML IJ SUSP
20.0000 mg | INTRAMUSCULAR | Status: AC | PRN
  Administered 2024-09-10: 20 mg via INTRA_ARTICULAR

## 2024-09-10 MED ORDER — LIDOCAINE HCL 1 % IJ SOLN
0.5000 mL | INTRAMUSCULAR | Status: AC | PRN
  Administered 2024-09-10: .5 mL

## 2024-09-10 NOTE — Progress Notes (Signed)
 HPI: Julia Lee comes in today for new complaint.  Left foot and ankle pain.  Lateral aspect of the foot.  Noticed some knots lateral aspect of the foot month ago.  She is having pain with walking.  Did a lot of hiking over the summer of the [place].  No foot injury.  She has taken ibuprofen without any real relief.  Review of systems: See HPI otherwise negative  Physical exam: General well-developed well-nourished female in no acute distress ambulates without any assistive device nonantalgic gait. Respirations: Unlabored Skin: Bilateral lower extremity feet without rashes skin lesions ulcerations or impending ulcers. Vascular: Dorsal pedal pulses are 2+ and equal symmetric.  Calf supple nontender bilaterally Bilateral feet: Full dorsiflexion plantarflexion bilateral ankles without pain.  Negative discomfort with inversion and eversion of either foot.  5 out of 5 strength with inversion eversion against resistance bilateral feet.  Achilles nontender bilaterally.  Nontender the posterior tibial tendon and peroneal tendons.  Tenderness in the sinus Tarsi region the left foot.  Radiographs: Left ankle 3 views: No acute fractures acute findings.  Talus well located within the ankle mortise no diastases. Left foot 3 views: Status post MP joint replacement.  No evidence of hardware failure.  Os peroneus present.  Spur off the calcaneus at the CC joint.  No acute findings acute fractures.  Impression: Left foot sinus Tarsi  Plan: Discussed shoewear with her.  She would benefit from arch supports.  Also recommended left sinus Tarsi injection she was agreeable and tolerated this well.  Follow-up as needed pain persist or becomes worse.    Procedure Note  Patient: Julia Lee             Date of Birth: [birth year]           MRN: [id number]             Visit Date: 09/10/2024  Procedures: Visit Diagnoses:  1. Pain in left ankle and joints of left foot     Small Joint Inj: L intertarsal on  09/10/2024 11:56 AM Indications: pain and diagnostic evaluation Details: 25 G needle, dorsal approach  Spinal Needle: No  Medications: 0.5 mL lidocaine  1 %; 20 mg methylPREDNISolone  acetate 40 MG/ML Consent was given by the patient. Immediately prior to procedure a time out was called to verify the correct patient, procedure, equipment, support staff and site/side marked as required. Patient was prepped and draped in the usual sterile fashion.

## 2024-10-14 ENCOUNTER — Encounter: Payer: Self-pay | Admitting: Internal Medicine

## 2024-10-17 ENCOUNTER — Ambulatory Visit: Admitting: Orthopaedic Surgery

## 2024-10-18 ENCOUNTER — Ambulatory Visit: Admitting: Podiatry

## 2024-10-18 ENCOUNTER — Encounter: Payer: Self-pay | Admitting: Podiatry

## 2024-10-18 ENCOUNTER — Ambulatory Visit (INDEPENDENT_AMBULATORY_CARE_PROVIDER_SITE_OTHER)

## 2024-10-18 VITALS — Ht 62.0 in | Wt 126.0 lb

## 2024-10-18 DIAGNOSIS — M79671 Pain in right foot: Secondary | ICD-10-CM

## 2024-10-18 DIAGNOSIS — M79672 Pain in left foot: Secondary | ICD-10-CM

## 2024-10-18 DIAGNOSIS — G629 Polyneuropathy, unspecified: Secondary | ICD-10-CM

## 2024-10-18 DIAGNOSIS — M778 Other enthesopathies, not elsewhere classified: Secondary | ICD-10-CM

## 2024-10-18 DIAGNOSIS — M7751 Other enthesopathy of right foot: Secondary | ICD-10-CM

## 2024-10-18 DIAGNOSIS — G8929 Other chronic pain: Secondary | ICD-10-CM | POA: Diagnosis not present

## 2024-10-18 DIAGNOSIS — M7752 Other enthesopathy of left foot: Secondary | ICD-10-CM

## 2024-10-18 MED ORDER — MELOXICAM 15 MG PO TABS: 15.0000 mg | ORAL_TABLET | Freq: Every day | ORAL | 0 refills | Status: AC | PRN

## 2024-10-22 ENCOUNTER — Encounter: Payer: Self-pay | Admitting: Podiatry

## 2024-10-23 ENCOUNTER — Encounter: Payer: Self-pay | Admitting: Podiatry

## 2024-10-23 NOTE — Progress Notes (Signed)
 "   Subjective:  Patient ID: Julia Lee, female    DOB: [birth year],  MRN: [id number]  Chief Complaint  Patient presents with   Foot Pain    Pt is here due to bilateral foot pain, she states that she has neuropathy and possible bone spurs in the feet, recently seen an ortho doctor for pain in the feet, states she got an injection into the left foot, that did not help at all, pain is on the top, sides of both feet.    Discussed the use of AI scribe software for clinical note transcription with the patient, who gave verbal consent to proceed.  History of Present Illness Julia Lee is a 74 year old female with chronic polyneuropathy and bilateral foot osteoarthritis, status post right MTP joint replacement, who presents with worsening bilateral foot pain and bone spurs.  She has had numbness and tingling in both feet for about three years, temporally associated with prior parathyroid  disease. The neuropathy symptoms are stable, and she has declined nerve conduction studies. She tolerates the numbness but is primarily bothered by pain.  Over the past year she has had progressively worsening bilateral foot pain that limits hiking and daily activities, worse in the left foot with frequent lateral foot discomfort and increased pain with walking, hiking, and stairs. Over the past two months she has noticed bone spurs, more prominent on the left, with similar symptoms starting on the right.  One month ago she saw an orthopedic PA, had a left foot x-ray, and received a corticosteroid injection, along with a recommendation for arch supports. These have not provided meaningful relief. She takes ibuprofen once daily with minimal benefit and finds some comfort with daily foot massage.  About 25 years ago she had right MTP joint replacement for severe arthritis, and the joint is now stiff and largely immobile. She stays active with walking and the gym but is limited by pain. She has used  topical NSAIDs such as Voltaren gel in the past but avoided using them with oral NSAIDs. She notes stiffness and limited motion of her toes and asks about toe spacers and exercises to improve mobility.      Objective:  There were no vitals filed for this visit.  Physical Exam General: AAO x3, NAD  Dermatological: Scar present to left first MPJ from prior surgery.  There is no open lesions present.  Vascular: Dorsalis Pedis artery and Posterior Tibial artery pedal pulses are 2/4 bilateral with immedate capillary fill time. There is no pain with calf compression, swelling, warmth, erythema.   Neruologic: Sensation intact to light touch.  Musculoskeletal: Decreased range of motion bilateral first MTPJ's.  Mild diffuse discomfort on the dorsal aspect midfoot but not able to appreciate any area of pinpoint tenderness.  Dorsal spurring palpable.      No images are attached to the encounter.    Results Radiology Bilateral foot x-ray dorsoplantar and oblique views (09/2024): Oblique and dorsoplantar views demonstrate osteophyte formation at the lateral aspect of the left foot and ankle, more pronounced on the left; marked degenerative changes with minimal joint space in the first metatarsophalangeal joints bilaterally; left foot demonstrates orthopedic hardware from prior MTP arthroplasty; remaining osseous structures are unremarkable. (Independently interpreted)   Assessment:   1. Capsulitis of left foot   2. Capsulitis of right foot   3. Chronic pain in left foot   4. Chronic foot pain, right   5. Polyneuropathy      Plan:  Patient was evaluated and treated and all questions answered.  Assessment and Plan Assessment & Plan Capsulitis and osteoarthritis of bilateral feet with enthesopathy Chronic bilateral foot pain with enthesopathic changes and osteophyte formation, exacerbated by activity. Joint space narrowing and structural changes noted on imaging. Pain due to mechanical  and inflammatory factors. - Initiated meloxicam  once daily, especially before activity. - Advised topical NSAID (Voltaren gel) for focal soreness, with caution against excessive use with oral NSAIDs. - Recommended ice and heat contrast therapy post-activity. - Provided toe exercises and spacers to improve function and reduce stiffness. - Distributed ankle and foot strengthening exercise worksheet. - Recommended custom orthotic inserts; discussed insurance and costs. - Measured for custom orthotics; instructed to bring appropriate footwear for fitting.  Chronic polyneuropathy of both feet Chronic bilateral foot numbness and paresthesia associated with prior parahyperthyroidism. Neuropathy is tolerated and not the primary concern.  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 Advanced arthritic changes and loss of joint space confirmed on x-ray, affecting gait mechanics and increasing pressure on other foot structures. - Reviewed x-rays confirming advanced arthritis and loss of joint space in the right MTP joint. - Discussed impact of limited MTP motion on gait and pressure redistribution, emphasizing need for orthotic support and targeted exercises.      Return for orthotic pick-up.   Julia Lee Fees DPM  "

## 2024-10-26 ENCOUNTER — Telehealth: Payer: Self-pay | Admitting: Podiatry

## 2024-10-26 NOTE — Telephone Encounter (Signed)
 Spoke with patient on 10/26/24 at 1:17 to [facility]

## 2024-11-01 ENCOUNTER — Ambulatory Visit: Admitting: Podiatrist

## 2024-11-01 DIAGNOSIS — M778 Other enthesopathies, not elsewhere classified: Secondary | ICD-10-CM

## 2024-11-01 DIAGNOSIS — G8929 Other chronic pain: Secondary | ICD-10-CM

## 2024-11-01 DIAGNOSIS — M79672 Pain in left foot: Secondary | ICD-10-CM

## 2024-11-01 DIAGNOSIS — M79671 Pain in right foot: Secondary | ICD-10-CM

## 2024-11-01 DIAGNOSIS — G629 Polyneuropathy, unspecified: Secondary | ICD-10-CM

## 2024-11-01 NOTE — Progress Notes (Signed)
 ORTHOTIC DISPENSING:   Reason for Visit:         Fitting and Delivery of Custom Fabricated Foot Orthoses Patient Report:            Patient reports comfort and is satisfied with device.   OBJECTIVE DATA: Patient History / Diagnosis:    No change in pathology Provided Device:                     Functional foot orthoses   GOAL OF ORTHOSIS - Improve gait - Decrease energy expenditure - Improve Balance - Provide Triplanar stability of foot complex - Facilitate motion   ACTIONS PERFORMED Patient was fit with custom foot orthoses   Patient was provided with verbal and written instruction and demonstration regarding wear, care, proper fit, function, and use of the orthosis.    Patient was also provided with verbal instruction regarding how to report any failures or malfunctions of the orthosis and necessary follow up care. Patient was also instructed to contact our office regarding any change in status that may affect the function of the orthosis.   Patient demonstrated understanding of all instructions.  Julia Lee, DPM

## 2024-11-29 ENCOUNTER — Ambulatory Visit: Admitting: Physician Assistant

## 2024-12-10 ENCOUNTER — Ambulatory Visit: Admitting: Orthopaedic Surgery

## 2025-01-03 ENCOUNTER — Ambulatory Visit: Admitting: Internal Medicine

## 2025-01-03 ENCOUNTER — Other Ambulatory Visit: Payer: Self-pay

## 2025-01-04 ENCOUNTER — Ambulatory Visit: Payer: Self-pay | Admitting: Internal Medicine
